# Patient Record
Sex: Male | Born: 1985 | Race: Black or African American | Hispanic: No | Marital: Single | Smoking: Current every day smoker
Health system: Southern US, Community
[De-identification: ages and names within clinical notes are randomized; demographics above are authoritative.]

---
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
## 2008-06-14 ENCOUNTER — Emergency Department: Payer: Self-pay | Admitting: Emergency Medicine

## 2011-08-02 ENCOUNTER — Emergency Department: Payer: Self-pay | Admitting: Emergency Medicine

## 2011-08-03 ENCOUNTER — Emergency Department: Payer: Self-pay | Admitting: Emergency Medicine

## 2011-08-03 LAB — DRUG SCREEN, URINE
Amphetamines, Ur Screen: NEGATIVE (ref ?–1000)
Barbiturates, Ur Screen: NEGATIVE (ref ?–200)
MDMA (Ecstasy)Ur Screen: NEGATIVE (ref ?–500)
Methadone, Ur Screen: NEGATIVE (ref ?–300)
Phencyclidine (PCP) Ur S: NEGATIVE (ref ?–25)

## 2011-08-03 LAB — CBC
HCT: 43.1 % (ref 40.0–52.0)
HGB: 14.3 g/dL (ref 13.0–18.0)
MCHC: 33.3 g/dL (ref 32.0–36.0)
RBC: 4.94 10*6/uL (ref 4.40–5.90)
WBC: 10.9 10*3/uL — ABNORMAL HIGH (ref 3.8–10.6)

## 2011-08-03 LAB — BASIC METABOLIC PANEL
Anion Gap: 8 (ref 7–16)
Calcium, Total: 9.1 mg/dL (ref 8.5–10.1)
Chloride: 104 mmol/L (ref 98–107)
Creatinine: 1.2 mg/dL (ref 0.60–1.30)
EGFR (Non-African Amer.): >60
Glucose: 118 mg/dL — ABNORMAL HIGH (ref 65–99)
Osmolality: 279 (ref 275–301)
Potassium: 3.6 mmol/L (ref 3.5–5.1)
Sodium: 139 mmol/L (ref 136–145)

## 2011-09-20 ENCOUNTER — Emergency Department: Payer: Self-pay | Admitting: Emergency Medicine

## 2014-06-07 ENCOUNTER — Emergency Department
Admission: EM | Admit: 2014-06-07 | Discharge: 2014-06-08 | Disposition: A | Discharge status: Home / Self Care | Payer: Self-pay | Attending: Emergency Medicine | Admitting: Emergency Medicine

## 2014-06-07 ENCOUNTER — Encounter: Payer: Self-pay | Admitting: Emergency Medicine

## 2014-06-07 DIAGNOSIS — F1210 Cannabis abuse, uncomplicated: Secondary | ICD-10-CM

## 2014-06-07 DIAGNOSIS — F32A Depression, unspecified: Secondary | ICD-10-CM

## 2014-06-07 DIAGNOSIS — F4310 Post-traumatic stress disorder, unspecified: Secondary | ICD-10-CM

## 2014-06-07 DIAGNOSIS — Z720 Tobacco use: Secondary | ICD-10-CM | POA: Insufficient documentation

## 2014-06-07 DIAGNOSIS — F329 Major depressive disorder, single episode, unspecified: Secondary | ICD-10-CM | POA: Insufficient documentation

## 2014-06-07 DIAGNOSIS — F320 Major depressive disorder, single episode, mild: Secondary | ICD-10-CM

## 2014-06-07 LAB — URINALYSIS COMPLETE WITH MICROSCOPIC (ARMC ONLY)
Bilirubin Urine: NEGATIVE
Glucose, UA: NEGATIVE mg/dL
Hgb urine dipstick: NEGATIVE
KETONES UR: NEGATIVE mg/dL
LEUKOCYTES UA: NEGATIVE
NITRITE: NEGATIVE
Protein, ur: 30 mg/dL — AB
Specific Gravity, Urine: 1.03 (ref 1.005–1.030)
pH: 6 (ref 5.0–8.0)

## 2014-06-07 LAB — CBC
HCT: 41.2 % (ref 40.0–52.0)
Hemoglobin: 12.9 g/dL — ABNORMAL LOW (ref 13.0–18.0)
MCH: 27.1 pg (ref 26.0–34.0)
MCHC: 31.4 g/dL — ABNORMAL LOW (ref 32.0–36.0)
MCV: 86.3 fL (ref 80.0–100.0)
Platelets: 344 10*3/uL (ref 150–440)
RBC: 4.78 MIL/uL (ref 4.40–5.90)
RDW: 14 % (ref 11.5–14.5)
WBC: 11.3 10*3/uL — ABNORMAL HIGH (ref 3.8–10.6)

## 2014-06-07 LAB — URINE DRUG SCREEN, QUALITATIVE (ARMC ONLY)
Amphetamines, Ur Screen: NOT DETECTED
BARBITURATES, UR SCREEN: NOT DETECTED
Benzodiazepine, Ur Scrn: NOT DETECTED
CANNABINOID 50 NG, UR ~~LOC~~: POSITIVE — AB
Cocaine Metabolite,Ur ~~LOC~~: NOT DETECTED
MDMA (Ecstasy)Ur Screen: NOT DETECTED
Methadone Scn, Ur: NOT DETECTED
OPIATE, UR SCREEN: NOT DETECTED
PHENCYCLIDINE (PCP) UR S: NOT DETECTED
Tricyclic, Ur Screen: NOT DETECTED

## 2014-06-07 LAB — COMPREHENSIVE METABOLIC PANEL
ALT: 13 U/L — AB (ref 17–63)
AST: 14 U/L — ABNORMAL LOW (ref 15–41)
Albumin: 3.7 g/dL (ref 3.5–5.0)
Alkaline Phosphatase: 74 U/L (ref 38–126)
Anion gap: 8 (ref 5–15)
BUN: 12 mg/dL (ref 6–20)
CO2: 26 mmol/L (ref 22–32)
Calcium: 9 mg/dL (ref 8.9–10.3)
Chloride: 105 mmol/L (ref 101–111)
Creatinine, Ser: 1.06 mg/dL (ref 0.61–1.24)
GFR calc Af Amer: >60 mL/min (ref >60)
Glucose, Bld: 90 mg/dL (ref 65–99)
Potassium: 3.7 mmol/L (ref 3.5–5.1)
Sodium: 139 mmol/L (ref 135–145)
TOTAL PROTEIN: 8.1 g/dL (ref 6.5–8.1)
Total Bilirubin: 0.5 mg/dL (ref 0.3–1.2)

## 2014-06-07 LAB — ETHANOL

## 2014-06-07 LAB — ACETAMINOPHEN LEVEL: Acetaminophen (Tylenol), Serum: <10 ug/mL — ABNORMAL LOW (ref 10–30)

## 2014-06-07 LAB — SALICYLATE LEVEL: Salicylate Lvl: <4 mg/dL (ref 2.8–30.0)

## 2014-06-07 NOTE — ED Notes (Signed)
BEHAVIORAL HEALTH ROUNDING Patient sleeping: No. Patient alert and oriented: yes Behavior appropriate: Yes.  ; If no, describe:  Nutrition and fluids offered: Yes  Toileting and hygiene offered: Yes  Sitter present: no Law enforcement present: Yes  

## 2014-06-07 NOTE — ED Notes (Signed)
Pt given sandwich tray and water. Pt resting in bed watching TV.

## 2014-06-07 NOTE — ED Notes (Signed)
Presents feeling depressed ..denies any s/i or h/i

## 2014-06-07 NOTE — ED Notes (Signed)
BEHAVIORAL HEALTH ROUNDING Patient sleeping: Yes.   Patient alert and oriented: yes Behavior appropriate: Yes.  ; If no, describe:  Nutrition and fluids offered: Yes  Toileting and hygiene offered: Yes  Sitter present: no Law enforcement present: Yes  

## 2014-06-07 NOTE — ED Notes (Signed)
Pt reports for the past week, not feeling right. Pt reports calling probation officer today and stating "It would be better if I died." Pt verbalized still thinking these thoughts but deneis SI or having a plan to hurt self. Pt denies HI but reports the area pt lives in is not the safest place and stated "you don't know from day to day, Innocent people die all the time." pt reports bouncing from home to home since leaving prison in 2010 where pt was because of drug dealing. Pt reports only taking pills and doing marijuana recently. When asked about blood products during assessment, pt stated "If it ever came to me dying, don't save me just let me go." Pt denies auditory or visual hallucinations.
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
## 2014-06-07 NOTE — ED Notes (Signed)

## 2014-06-07 NOTE — ED Provider Notes (Signed)
[facility] Emergency Department Provider Note  ____________________________________________  Time seen: Approximately 6:10 PM  I have reviewed the triage vital signs and the nursing notes.   HISTORY  Chief Complaint Depression    HPI Peter Sparks is a 29 y.o. male with a history of depression but which as been untreated who called his probation officer today because he thought that it would be better off if he is dead.  He indicates having worsening depression for several weeks.  He has not had a specific plan for how to kill himself but he does indicate during my evaluation that he thinks about being dead a lot and thinks that it would be better for him.  He denies having any homicidal ideation.  He has no medical complaints or concerns at this time.  He describes his symptoms as severe.   History reviewed. No pertinent past medical history.  There are no active problems to display for this patient.   History reviewed. No pertinent past surgical history.  No current outpatient prescriptions on file.  Allergies Review of patient's allergies indicates no known allergies.  History reviewed. No pertinent family history.  Social History History  Substance Use Topics  . Smoking status: Current Every Day Smoker  . Smokeless tobacco: Not on file  . Alcohol Use: Yes    Review of Systems Constitutional: No fever/chills Eyes: No visual changes. ENT: No sore throat. Cardiovascular: Denies chest pain. Respiratory: Denies shortness of breath. Gastrointestinal: No abdominal pain.  No nausea, no vomiting.  No diarrhea.  No constipation. Genitourinary: Negative for dysuria. Musculoskeletal: Negative for back pain. Skin: Negative for rash. Neurological: Negative for headaches, focal weakness or numbness. Psychiatric:Increasing depression with suicidal ideation without plan 10-point ROS otherwise  negative.  ____________________________________________   PHYSICAL EXAM:  VITAL SIGNS: ED Triage Vitals  Enc Vitals Group     BP 06/07/14 1633 146/77 mmHg     Pulse Rate 06/07/14 1633 56     Resp 06/07/14 1633 18     Temp 06/07/14 1633 98.6 F (37 C)     Temp Source 06/07/14 1633 Oral     SpO2 06/07/14 1633 100 %     Weight 06/07/14 1633 365 lb (165.563 kg)     Height 06/07/14 1633 6' (1.829 m)     Head Cir --      Peak Flow --      Pain Score --      Pain Loc --      Pain Edu? --      Excl. in GC? --     Constitutional: Alert and oriented. Well appearing and in no acute distress. Eyes: Conjunctivae are normal. PERRL. EOMI. Head: Atraumatic. Nose: No congestion/rhinnorhea. Mouth/Throat: Mucous membranes are moist.  Oropharynx non-erythematous. Neck: No stridor.   Cardiovascular: Normal rate, regular rhythm. Grossly normal heart sounds.  Good peripheral circulation. Respiratory: Normal respiratory effort.  No retractions. Lungs CTAB. Gastrointestinal: Soft and nontender. No distention. No abdominal bruits. No CVA tenderness. Musculoskeletal: No lower extremity tenderness nor edema.  No joint effusions. Neurologic:  Normal speech and language. No gross focal neurologic deficits are appreciated. Speech is normal. No gait instability. Skin:  Skin is warm, dry and intact. No rash noted. Psychiatric: The patient has a very flat affect and a depressed mood.  He endorses SI and denies HI.  ____________________________________________   LABS (all labs ordered are listed, but only abnormal results are displayed)  Labs Reviewed  CBC - Abnormal; Notable for  the following:    WBC 11.3 (*)    Hemoglobin 12.9 (*)    MCHC 31.4 (*)    All other components within normal limits  COMPREHENSIVE METABOLIC PANEL - Abnormal; Notable for the following:    AST 14 (*)    ALT 13 (*)    All other components within normal limits  ACETAMINOPHEN LEVEL - Abnormal; Notable for the following:     Acetaminophen (Tylenol), Serum <10 (*)    All other components within normal limits  URINE DRUG SCREEN, QUALITATIVE ([facility] ONLY) - Abnormal; Notable for the following:    Cannabinoid 50 Ng, Ur [place] POSITIVE (*)    All other components within normal limits  URINALYSIS COMPLETEWITH MICROSCOPIC ([facility] ONLY) - Abnormal; Notable for the following:    Color, Urine YELLOW (*)    APPearance HAZY (*)    Protein, ur 30 (*)    Bacteria, UA RARE (*)    Squamous Epithelial / LPF 0-5 (*)    All other components within normal limits  ETHANOL  SALICYLATE LEVEL   ____________________________________________  EKG  Not indicated ____________________________________________  RADIOLOGY  Not indicated  ____________________________________________  INITIAL IMPRESSION / ASSESSMENT AND PLAN / ED COURSE  Pertinent labs & imaging results that were available during my care of the patient were reviewed by me and considered in my medical decision making (see chart for details).  The patient has a very flat affect and depressed mood and he is endorsing SI.  He is voluntary at this time but I am going to IVC and given that I believe he is at high risk given his drug abuse history (marijuana and Molly) and history of prior incarceration.  He has no acute medical issues at this time.  ____________________________________________  FINAL CLINICAL IMPRESSION(S) / ED DIAGNOSES  Final diagnoses:  Depression      NEW MEDICATIONS STARTED DURING THIS VISIT:  New Prescriptions   No medications on file     Loleta Rose, MD 06/07/14 [phone]

## 2014-06-08 DIAGNOSIS — F1210 Cannabis abuse, uncomplicated: Secondary | ICD-10-CM

## 2014-06-08 DIAGNOSIS — F4310 Post-traumatic stress disorder, unspecified: Secondary | ICD-10-CM

## 2014-06-08 DIAGNOSIS — F320 Major depressive disorder, single episode, mild: Secondary | ICD-10-CM

## 2014-06-08 NOTE — ED Notes (Signed)
[facility]. Patient care assumed. Patient/RN introduction complete. Will continue to monitor.

## 2014-06-08 NOTE — ED Notes (Signed)

## 2014-06-08 NOTE — ED Notes (Signed)
BEHAVIORAL HEALTH ROUNDING Patient sleeping: Yes.   Patient alert and oriented: not applicable Behavior appropriate: Yes.  ; If no, describe:  Nutrition and fluids offered: No Toileting and hygiene offered: No Sitter present: yes Law enforcement present: Yes   

## 2014-06-08 NOTE — ED Notes (Signed)
BEHAVIORAL HEALTH ROUNDING Patient sleeping: No. Patient alert and oriented: yes Behavior appropriate: Yes.  ; If no, describe:  Nutrition and fluids offered: Yes  Toileting and hygiene offered: Yes  Sitter present: yes Law enforcement present: Yes  

## 2014-06-08 NOTE — ED Notes (Signed)
Pt has friend visiting.

## 2014-06-08 NOTE — ED Notes (Signed)

## 2014-06-08 NOTE — Discharge Instructions (Signed)

## 2014-06-08 NOTE — ED Notes (Signed)
No change in condition will continue to monitor  

## 2014-06-08 NOTE — ED Notes (Signed)
ENVIRONMENTAL ASSESSMENT Potentially harmful objects out of patient reach: Yes.   Personal belongings secured: Yes.   Patient dressed in hospital provided attire only: Yes.   Plastic bags out of patient reach: Yes.   Patient care equipment (cords, cables, call bells, lines, and drains) shortened, removed, or accounted for: Yes.   Equipment and supplies removed from bottom of stretcher: Yes.   Potentially toxic materials out of patient reach: Yes.   Sharps container removed or out of patient reach: Yes.   BEHAVIORAL HEALTH ROUNDING Patient sleeping: No. Patient alert and oriented: yes Behavior appropriate: Yes.   Nutrition and fluids offered: Yes  Toileting and hygiene offered: Yes  Sitter present: q15 min observations and security camera monitoring Law enforcement present: Yes [employer]  ED BHU PLACEMENT JUSTIFICATION Is the patient under IVC or is there intent for IVC: No. Is the patient medically cleared: Yes.   Is there vacancy in the ED BHU: Yes.   Is the population mix appropriate for patient: No. Is the patient awaiting placement in inpatient or outpatient setting: No. Has the patient had a psychiatric consult: Yes.   Survey of unit performed for contraband, proper placement and condition of furniture, tampering with fixtures in bathroom, shower, and each patient room: Yes.  ; Findings: none APPEARANCE/BEHAVIOR calm NEURO ASSESSMENT Orientation: person Hallucinations: No.None noted at this time Speech: Normal Gait: normal RESPIRATORY ASSESSMENT No respiratory distress noted CARDIOVASCULAR ASSESSMENT Skin color appropriate for age and race GASTROINTESTINAL ASSESSMENT no GI distress noted EXTREMITIES Moves all extremities PLAN OF CARE Provide calm/safe environment. Vital signs assessed twice daily. ED BHU Assessment once each 12-hour shift. Collaborate with intake RN daily or as condition indicates. Assure the ED provider has rounded once each shift. Provide and  encourage hygiene. Provide redirection as needed. Assess for escalating behavior; address immediately and inform ED provider.  Assess family dynamic and appropriateness for visitation as needed: Yes.   Educate the patient/family about BHU procedures/visitation: Yes.

## 2014-06-08 NOTE — ED Notes (Signed)
Pt laying in bed.  

## 2014-06-08 NOTE — ED Provider Notes (Signed)
Patient seen and cleared for discharge by Dr. Toni Amendlapacs.  Sharyn CreamerMark Paulino Cork, MD 06/08/14 [phone]

## 2014-06-08 NOTE — ED Notes (Signed)
BEHAVIORAL HEALTH ROUNDING Patient sleeping: Yes.   Patient alert and oriented: yes Behavior appropriate: Yes.  ; If no, describe:  Nutrition and fluids offered: No Toileting and hygiene offered: No Sitter present: yes Law enforcement present: Yes  

## 2014-06-08 NOTE — Consult Note (Signed)
[facility] Face-to-Face Psychiatry Consult   Reason for Consult:  Consult for this 29 year old man came to the emergency room voluntarily in the company of his probation officer Referring Physician:  quale Patient Identification: Peter Sparks MRN:  [id number] Principal Diagnosis: Depression, major, single episode, mild Diagnosis:   Patient Active Problem List   Diagnosis Date Noted  . Depression, major, single episode, mild [F32.0] 06/08/2014  . Cannabis abuse [F12.10] 06/08/2014  . PTSD (post-traumatic stress disorder) [F43.10] 06/08/2014    Total Time spent with patient: 1 hour  Subjective:   Peter Sparks is a 29 y.o. 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 Complaints are relatively vague. Patient does complain of being "stressed out".  HPI:  Information from the patient primarily some from the chart. This is a 29 year old man who evidently has had no previous psychiatric treatment who is probation officer told him to come to the emergency room today because he was concerned about him. Exactly in what way he was concerned is unclear. The patient himself admits that he's been stressed out and says that people in his family told him that he needs to talk to someone because he is acting "bipolar". He is at something of a loss to describe this but when I ask him if he meant that his mood had been very up and down and extreme he said that he thought that might be it. He himself said that his mood is not feeling so good and he described himself as being depressed. Probably been going on for a few weeks. Sleep is poor and frequently wakes at night. Appetite normal. Denies any acute suicidal thoughts. Denies feeling hopeless. Patient indicates that there are a lot of things in his life that are  bothering him but that he is unwilling to discuss them. He gives some hints that he is still involved possibly in the illegal activities which may be one reason why he doesn't want to discuss them. Also it sounds like he's had a lot of experiences in prison. He denies having any hallucinations or psychotic symptoms. He denies that he is abusing any drugs except for marijuana regularly.  Past psychiatric history: Says that he was involved in counseling groups while he was in prison but that he thought that he never had anything in common with people who were severely ill there. He denies any past psychiatric hospitalization and denies any past psychiatric medicine being prescribed no history of suicide attempts.  Social history: Patient got out of prison last summer and is on probation. He is living with a couple of his siblings. He says that his siblings of the people he is closest to. He is not working regularly but he says that he "gets by" he does not have any children that he claims.  Family history: He thinks there is family history of substance abuse and also of some depression. Also thinks that there is someone who has "bipolar".  Medical history: No known ongoing significant medical problems  Medications none HPI Elements:   Quality:  Dysphoric mood and anxiety. Severity:  Mild to moderate. Timing:  Probably at least a month may be more. Duration:  Symptoms are fairly consistent at this point. Context:  Still not working regularly and he says he is having a hard  time adjusting to life outside of prison..  Past Medical History: History reviewed. No pertinent past medical history. History reviewed. No pertinent past surgical history. Family History: History reviewed. No pertinent family history. Social History:  History  Alcohol Use  . Yes     History  Drug Use  . Yes  . Special: Marijuana    Comment: Molly    History   Social History  . Marital Status: Single    Spouse Name:  N/A  . Number of Children: N/A  . Years of Education: N/A   Social History Main Topics  . Smoking status: Current Every Day Smoker  . Smokeless tobacco: Not on file  . Alcohol Use: Yes  . Drug Use: Yes    Special: Marijuana     Comment: Molly  . Sexual Activity: Not Currently   Other Topics Concern  . None   Social History Narrative  . None   Additional Social History:    History of alcohol / drug use?: Yes Longest period of sobriety (when/how long): 2 weeks Negative Consequences of Use: Financial, Personal relationships, Legal Withdrawal Symptoms: Agitation, Irritability Name of Substance 1: MDMA 1 - Age of First Use: 24 1 - Amount (size/oz): Unsure 1 - Frequency: 3-4 times week 1 - Last Use / Amount: 06/01/2014 Name of Substance 2: Marijuana 2 - Age of First Use: 10 2 - Amount (size/oz): Unsure 2 - Frequency: Random - due to probation, prior use daily 2 - Last Use / Amount: 06/06/2014 Name of Substance 3: Percocet 3 - Age of First Use: 20 3 - Amount (size/oz): 10 mg 3 - Frequency: Daily 3 - Last Use / Amount: about a month ago               Allergies:  No Known Allergies  Labs:  Results for orders placed or performed during the hospital encounter of 06/07/14 (from the past 48 hour(s))  Urine Drug Screen, Qualitative ([place] only)     Status: Abnormal   Collection Time: 06/07/14  5:00 PM  Result Value Ref Range   Tricyclic, Ur Screen NONE DETECTED NONE DETECTED   Amphetamines, Ur Screen NONE DETECTED NONE DETECTED   MDMA (Ecstasy)Ur Screen NONE DETECTED NONE DETECTED   Cocaine Metabolite,Ur [place] NONE DETECTED NONE DETECTED   Opiate, Ur Screen NONE DETECTED NONE DETECTED   Phencyclidine (PCP) Ur S NONE DETECTED NONE DETECTED   Cannabinoid 50 Ng, Ur [place] POSITIVE (A) NONE DETECTED   Barbiturates, Ur Screen NONE DETECTED NONE DETECTED   Benzodiazepine, Ur Scrn NONE DETECTED NONE DETECTED   Methadone Scn, Ur NONE DETECTED NONE DETECTED    Comment: (NOTE) 641   Tricyclics, urine               Cutoff 1000 ng/mL 200  Amphetamines, urine             Cutoff 1000 ng/mL 300  MDMA (Ecstasy), urine           Cutoff 500 ng/mL 400  Cocaine Metabolite, urine       Cutoff 300 ng/mL 500  Opiate, urine                   Cutoff 300 ng/mL 600  Phencyclidine (PCP), urine      Cutoff 25 ng/mL 700  Cannabinoid, urine              Cutoff 50 ng/mL 800  Barbiturates, urine  Cutoff 200 ng/mL 900  Benzodiazepine, urine           Cutoff 200 ng/mL 1000 Methadone, urine                Cutoff 300 ng/mL 1100 1200 The urine drug screen provides only a preliminary, unconfirmed 1300 analytical test result and should not be used for non-medical 1400 purposes. Clinical consideration and professional judgment should 1500 be applied to any positive drug screen result due to possible 1600 interfering substances. A more specific alternate chemical method 1700 must be used in order to obtain a confirmed analytical result.  1800 Gas chromato graphy / mass spectrometry (GC/MS) is the preferred 1900 confirmatory method.   Urinalysis complete, with microscopic ([facility] only)     Status: Abnormal   Collection Time: 06/07/14  5:00 PM  Result Value Ref Range   Color, Urine YELLOW (A) YELLOW   APPearance HAZY (A) CLEAR   Glucose, UA NEGATIVE NEGATIVE mg/dL   Bilirubin Urine NEGATIVE NEGATIVE   Ketones, ur NEGATIVE NEGATIVE mg/dL   Specific Gravity, Urine 1.030 1.005 - 1.030   Hgb urine dipstick NEGATIVE NEGATIVE   pH 6.0 5.0 - 8.0   Protein, ur 30 (A) NEGATIVE mg/dL   Nitrite NEGATIVE NEGATIVE   Leukocytes, UA NEGATIVE NEGATIVE   RBC / HPF 0-5 0 - 5 RBC/hpf   WBC, UA 0-5 0 - 5 WBC/hpf   Bacteria, UA RARE (A) NONE SEEN   Squamous Epithelial / LPF 0-5 (A) NONE SEEN   Mucous PRESENT   CBC     Status: Abnormal   Collection Time: 06/07/14  5:15 PM  Result Value Ref Range   WBC 11.3 (H) 3.8 - 10.6 K/uL   RBC 4.78 4.40 - 5.90 MIL/uL   Hemoglobin 12.9 (L) 13.0 - 18.0 g/dL    HCT 41.2 40.0 - 52.0 %   MCV 86.3 80.0 - 100.0 fL   MCH 27.1 26.0 - 34.0 pg   MCHC 31.4 (L) 32.0 - 36.0 g/dL   RDW 14.0 11.5 - 14.5 %   Platelets 344 150 - 440 K/uL  Comprehensive metabolic panel     Status: Abnormal   Collection Time: 06/07/14  5:15 PM  Result Value Ref Range   Sodium 139 135 - 145 mmol/L   Potassium 3.7 3.5 - 5.1 mmol/L   Chloride 105 101 - 111 mmol/L   CO2 26 22 - 32 mmol/L   Glucose, Bld 90 65 - 99 mg/dL   BUN 12 6 - 20 mg/dL   Creatinine, Ser 1.06 0.61 - 1.24 mg/dL   Calcium 9.0 8.9 - 10.3 mg/dL   Total Protein 8.1 6.5 - 8.1 g/dL   Albumin 3.7 3.5 - 5.0 g/dL   AST 14 (L) 15 - 41 U/L   ALT 13 (L) 17 - 63 U/L   Alkaline Phosphatase 74 38 - 126 U/L   Total Bilirubin 0.5 0.3 - 1.2 mg/dL   GFR calc non Af Amer >60 >60 mL/min   GFR calc Af Amer >60 >60 mL/min    Comment: (NOTE) The eGFR has been calculated using the CKD EPI equation. This calculation has not been validated in all clinical situations. eGFR's persistently <60 mL/min signify possible Chronic Kidney Disease.    Anion gap 8 5 - 15  Ethanol (ETOH)     Status: None   Collection Time: 06/07/14  5:15 PM  Result Value Ref Range   Alcohol, Ethyl (B) <5 <5 mg/dL    Comment:  LOWEST DETECTABLE LIMIT FOR SERUM ALCOHOL IS 11 mg/dL FOR MEDICAL PURPOSES ONLY   Acetaminophen level     Status: Abnormal   Collection Time: 06/07/14  5:15 PM  Result Value Ref Range   Acetaminophen (Tylenol), Serum <10 (L) 10 - 30 ug/mL    Comment:        THERAPEUTIC CONCENTRATIONS VARY SIGNIFICANTLY. A RANGE OF 10-30 ug/mL MAY BE AN EFFECTIVE CONCENTRATION FOR MANY PATIENTS. HOWEVER, SOME ARE BEST TREATED AT CONCENTRATIONS OUTSIDE THIS RANGE. ACETAMINOPHEN CONCENTRATIONS >150 ug/mL AT 4 HOURS AFTER INGESTION AND >50 ug/mL AT 12 HOURS AFTER INGESTION ARE OFTEN ASSOCIATED WITH TOXIC REACTIONS.   Salicylate level     Status: None   Collection Time: 06/07/14  5:15 PM  Result Value Ref Range   Salicylate Lvl  <8.9 2.8 - 30.0 mg/dL    Vitals: Blood pressure 132/84, pulse 87, temperature 98.1 F (36.7 C), temperature source Oral, resp. rate 18, height 6' (1.829 m), weight 165.563 kg (365 lb), SpO2 100 %.  Risk to Self: Suicidal Ideation: No (States no, but says "I feel like I'd be better off dead") Suicidal Intent: No Is patient at risk for suicide?: No Suicidal Plan?: No Access to Means: No What has been your use of drugs/alcohol within the last 12 months?: Use of MDMA, Marijuana, and Percocets How many times?: 0 Other Self Harm Risks: None reported Triggers for Past Attempts: Other (Comment) (None reported) Intentional Self Injurious Behavior: None Risk to Others: Homicidal Ideation: No Thoughts of Harm to Others: No Current Homicidal Intent: No Current Homicidal Plan: No Access to Homicidal Means: No Identified Victim: None reported History of harm to others?: No Assessment of Violence: In distant past Does patient have access to weapons?: No Criminal Charges Pending?: No Does patient have a court date: No Prior Inpatient Therapy: Prior Inpatient Therapy: No Prior Outpatient Therapy: Does patient have an ACCT team?: No Does patient have Intensive In-House Services?  : No Does patient have [facility] services? : No Does patient have [facility] services?: No  No current facility-administered medications for this encounter.   No current outpatient prescriptions on file.    Musculoskeletal: Strength & Muscle Tone: within normal limits Gait & Station: normal Patient leans: N/A  Psychiatric Specialty Exam: Physical Exam  Constitutional: He appears well-developed and well-nourished.  HENT:  Head: Normocephalic and atraumatic.  Eyes: Conjunctivae are normal. Pupils are equal, round, and reactive to light.  Neck: Normal range of motion.  Cardiovascular: Normal heart sounds.   Respiratory: Effort normal.  GI: Soft.  Musculoskeletal: Normal range of motion.  Neurological: He is alert.   Skin: Skin is warm and dry.  Psychiatric: Judgment and thought content normal. His affect is blunt. His speech is delayed. He is withdrawn. Cognition and memory are normal.  Patient is very guarded and hesitant to discuss anything about his personal problems but does not appear to be hostile and does appear to be lucid with no sign of psychosis.    Review of Systems  Constitutional: Negative.   HENT: Negative.   Eyes: Negative.   Respiratory: Negative.   Cardiovascular: Negative.   Gastrointestinal: Negative.   Musculoskeletal: Negative.   Skin: Negative.   Neurological: Negative.   Psychiatric/Behavioral: Positive for depression and substance abuse. Negative for suicidal ideas, hallucinations and memory loss. The patient is nervous/anxious and has insomnia.     Blood pressure 132/84, pulse 87, temperature 98.1 F (36.7 C), temperature source Oral, resp. rate 18, height 6' (1.829 m), weight 165.563 kg (  365 lb), SpO2 100 %.Body mass index is 49.49 kg/(m^2).  General Appearance: Negative  Eye Contact::  Good  Speech:  Clear and Coherent  Volume:  Decreased  Mood:  Depressed  Affect:  Blunt  Thought Process:  Linear and Logical  Orientation:  Full (Time, Place, and Person)  Thought Content:  Negative  Suicidal Thoughts:  No  Homicidal Thoughts:  No  Memory:  Immediate;   Good Recent;   Good Remote;   Good  Judgement:  Intact  Insight:  Fair  Psychomotor Activity:  Normal  Concentration:  Good  Recall:  Good  Fund of Knowledge:Good  Language: Good  Akathisia:  No  Handed:  Right  AIMS (if indicated):     Assets:  Desire for Improvement Housing Physical Health Social Support  ADL's:  Intact  Cognition: WNL  Sleep:      Medical Decision Making: New problem, with additional work up planned, Review of Psycho-Social Stressors (1), Review or order clinical lab tests (1) and Review or order medicine tests (1)  Treatment Plan Summary: Plan Patient does not meet commitment  criteria and really does not appear to require inpatient hospitalization. There is no indication of suicidal behavior or homicidal behavior and he is denying suicidal or homicidal ideation. He is lucid and calm. He is not very forthcoming with information and seems to probably have little experience with trusting people. Hard to say exactly whether his diagnosis might be posttraumatic stress disorder as I suspect or mild to moderate depression or an adjustment disorder. For now I will give him a major depression mild along with rule out PTSD and cannabis abuse. Patient is strongly encouraged to go to [facility] for outpatient treatment. Psychoeducation completed. Case will be discussed with emergency room physician and he may be discharged at this time. No prescriptions written I'm not sure what the right thing would be and I don't want to add any extra side effects or problems.  Plan:  No evidence of imminent risk to self or others at present.   Patient does not meet criteria for psychiatric inpatient admission. Supportive therapy provided about ongoing stressors. Discussed crisis plan, support from social network, calling 911, coming to the Emergency Department, and calling Suicide Hotline. Disposition: Patient may be discharged at the discretion of the emergency room physician. He is to follow-up with community mental health care  Alethia Berthold 06/08/2014 7:35 PM

## 2014-06-08 NOTE — ED Notes (Signed)

## 2014-06-08 NOTE — ED Notes (Signed)

## 2014-06-08 NOTE — ED Notes (Signed)
BEHAVIORAL HEALTH ROUNDING Patient sleeping: Yes.   Patient alert and oriented: not applicable Behavior appropriate: Yes.  ; If no, describe:  Nutrition and fluids offered: No Toileting and hygiene offered: No Sitter present: yes Law enforcement present: Yes   ENVIRONMENTAL ASSESSMENT Potentially harmful objects out of patient reach: Yes.   Personal belongings secured: Yes.   Patient dressed in hospital provided attire only: Yes.   Plastic bags out of patient reach: Yes.   Patient care equipment (cords, cables, call bells, lines, and drains) shortened, removed, or accounted for: Yes.   Equipment and supplies removed from bottom of stretcher: Yes.   Potentially toxic materials out of patient reach: Yes.   Sharps container removed or out of patient reach: Yes.    

## 2014-06-08 NOTE — ED Notes (Signed)
Pt calm and cooperative at this time with no complaints of pain or discomfort will continue to monitor.   

## 2014-06-08 NOTE — Progress Notes (Signed)
LCSW met with patient and he reported he was not suicidal or homicidal or hearing or seeing things. Patient will follow up with [facility]. Reviewed suicide prevention and intervention with patient and handout provided. Consulted with Dr Clapacs/Dr Quale and patient discharge documentation will be completed. [facility] handouts will be given to patients by ED nurses. 

## 2014-06-08 NOTE — ED Notes (Signed)
Pt. transfered to BHU without incident after report from. Placed in room and oriented to unit. Pt. informed that for their safety all care areas are designed for safety and monitored by security cameras at all times; and visiting hours explained to patient. Patient verbalizes understanding, and verbal contract for safety obtained.   

## 2014-06-08 NOTE — ED Provider Notes (Signed)
-----------------------------------------   1:44 PM on 06/08/2014 -----------------------------------------  No acute events overnight. Patient calm, cooperative per nursing. Patient here with vague SI-like statements, and admitted increased depression. Patient has been seen by the behavioral health nurse, currently awaiting psychiatrist evaluation for proper disposition. Labs are largely within normal limits, urine toxicology positive for cannabinoids.  Minna AntisKevin Wiley Magan, MD 06/08/14 1345

## 2014-06-08 NOTE — ED Notes (Signed)
BEHAVIORAL HEALTH ROUNDING Patient sleeping: No. Patient alert and oriented: Yes Behavior appropriate: Yes.  ; If no, describe:  Nutrition and fluids offered: Yes  Toileting and hygiene offered: Yes  Sitter present: No Law enforcement present: Yes  

## 2014-07-10 ENCOUNTER — Emergency Department: Payer: Self-pay

## 2014-07-10 ENCOUNTER — Other Ambulatory Visit: Payer: Self-pay

## 2014-07-10 ENCOUNTER — Encounter: Payer: Self-pay | Admitting: Emergency Medicine

## 2014-07-10 ENCOUNTER — Emergency Department
Admission: EM | Admit: 2014-07-10 | Discharge: 2014-07-11 | Disposition: A | Discharge status: Home / Self Care | Payer: Self-pay | Attending: Emergency Medicine | Admitting: Emergency Medicine

## 2014-07-10 DIAGNOSIS — R109 Unspecified abdominal pain: Secondary | ICD-10-CM | POA: Insufficient documentation

## 2014-07-10 DIAGNOSIS — Z720 Tobacco use: Secondary | ICD-10-CM | POA: Insufficient documentation

## 2014-07-10 DIAGNOSIS — R1110 Vomiting, unspecified: Secondary | ICD-10-CM

## 2014-07-10 DIAGNOSIS — E860 Dehydration: Secondary | ICD-10-CM | POA: Insufficient documentation

## 2014-07-10 DIAGNOSIS — R197 Diarrhea, unspecified: Secondary | ICD-10-CM | POA: Insufficient documentation

## 2014-07-10 DIAGNOSIS — B349 Viral infection, unspecified: Secondary | ICD-10-CM | POA: Insufficient documentation

## 2014-07-10 DIAGNOSIS — R509 Fever, unspecified: Secondary | ICD-10-CM

## 2014-07-10 LAB — PROTIME-INR
INR: 1.08
Prothrombin Time: 14.2 seconds (ref 11.4–15.0)

## 2014-07-10 LAB — POCT RAPID STREP A: STREPTOCOCCUS, GROUP A SCREEN (DIRECT): NEGATIVE

## 2014-07-10 LAB — CBC WITH DIFFERENTIAL/PLATELET
BASOS ABS: 0 10*3/uL (ref 0–0.1)
Basophils Relative: 0 %
Eosinophils Absolute: 0 10*3/uL (ref 0–0.7)
Eosinophils Relative: 0 %
HEMATOCRIT: 38 % — AB (ref 40.0–52.0)
Hemoglobin: 12.7 g/dL — ABNORMAL LOW (ref 13.0–18.0)
Lymphocytes Relative: 22 %
Lymphs Abs: 2.9 10*3/uL (ref 1.0–3.6)
MCH: 27.9 pg (ref 26.0–34.0)
MCHC: 33.4 g/dL (ref 32.0–36.0)
MCV: 83.3 fL (ref 80.0–100.0)
Monocytes Absolute: 1.4 10*3/uL — ABNORMAL HIGH (ref 0.2–1.0)
Monocytes Relative: 11 %
Neutro Abs: 8.8 10*3/uL — ABNORMAL HIGH (ref 1.4–6.5)
Neutrophils Relative %: 67 %
Platelets: 280 10*3/uL (ref 150–440)
RBC: 4.56 MIL/uL (ref 4.40–5.90)
RDW: 13.4 % (ref 11.5–14.5)
WBC: 13.2 10*3/uL — ABNORMAL HIGH (ref 3.8–10.6)

## 2014-07-10 LAB — APTT: aPTT: 31 seconds (ref 24–36)

## 2014-07-10 MED ORDER — ACETAMINOPHEN 325 MG PO TABS
ORAL_TABLET | ORAL | Status: AC
  Administered 2014-07-10: 650 mg via ORAL
  Filled 2014-07-10: qty 2

## 2014-07-10 MED ORDER — ACETAMINOPHEN 325 MG PO TABS
650.0000 mg | ORAL_TABLET | Freq: Four times a day (QID) | ORAL | Status: DC | PRN
  Administered 2014-07-10: 650 mg via ORAL

## 2014-07-10 MED ORDER — SODIUM CHLORIDE 0.9 % IV BOLUS (SEPSIS)
1000.0000 mL | INTRAVENOUS | Status: AC
  Administered 2014-07-10 – 2014-07-11 (×3): 1000 mL via INTRAVENOUS

## 2014-07-10 MED ORDER — SODIUM CHLORIDE 0.9 % IV BOLUS (SEPSIS): 1000.0000 mL | INTRAVENOUS | Status: AC

## 2014-07-10 NOTE — ED Notes (Signed)
Pt presents w/ c/o n/v/d x 3-4 days.

## 2014-07-10 NOTE — ED Notes (Addendum)
Pt. States vomiting, diarrhea, chills, fever and sore throat for the past 3 days.  Pt. Denies anyone in household with same symptoms.  Pt. States starting Theraflu Thursday morning with no relief.

## 2014-07-11 ENCOUNTER — Encounter: Payer: Self-pay | Admitting: Emergency Medicine

## 2014-07-11 ENCOUNTER — Emergency Department: Payer: Self-pay

## 2014-07-11 LAB — URINALYSIS COMPLETE WITH MICROSCOPIC (ARMC ONLY)
Bacteria, UA: NONE SEEN
Bilirubin Urine: NEGATIVE
Glucose, UA: NEGATIVE mg/dL
Hgb urine dipstick: NEGATIVE
Ketones, ur: NEGATIVE mg/dL
Leukocytes, UA: NEGATIVE
Nitrite: NEGATIVE
PH: 7 (ref 5.0–8.0)
PROTEIN: NEGATIVE mg/dL
Specific Gravity, Urine: 1.046 — ABNORMAL HIGH (ref 1.005–1.030)

## 2014-07-11 LAB — COMPREHENSIVE METABOLIC PANEL
ALT: 13 U/L — AB (ref 17–63)
AST: 18 U/L (ref 15–41)
Albumin: 3.3 g/dL — ABNORMAL LOW (ref 3.5–5.0)
Alkaline Phosphatase: 60 U/L (ref 38–126)
Anion gap: 10 (ref 5–15)
BUN: 14 mg/dL (ref 6–20)
CALCIUM: 8.2 mg/dL — AB (ref 8.9–10.3)
CO2: 22 mmol/L (ref 22–32)
Chloride: 102 mmol/L (ref 101–111)
Creatinine, Ser: 1.32 mg/dL — ABNORMAL HIGH (ref 0.61–1.24)
GFR calc Af Amer: >60 mL/min (ref >60)
Glucose, Bld: 108 mg/dL — ABNORMAL HIGH (ref 65–99)
Potassium: 3 mmol/L — ABNORMAL LOW (ref 3.5–5.1)
SODIUM: 134 mmol/L — AB (ref 135–145)
Total Bilirubin: 0.9 mg/dL (ref 0.3–1.2)
Total Protein: 7.8 g/dL (ref 6.5–8.1)

## 2014-07-11 LAB — TROPONIN I

## 2014-07-11 LAB — LACTIC ACID, PLASMA: Lactic Acid, Venous: 1 mmol/L (ref 0.5–2.0)

## 2014-07-11 LAB — LIPASE, BLOOD: LIPASE: 26 U/L (ref 22–51)

## 2014-07-11 MED ORDER — IOHEXOL 350 MG/ML SOLN
125.0000 mL | Freq: Once | INTRAVENOUS | Status: AC | PRN
  Administered 2014-07-11: 125 mL via INTRAVENOUS

## 2014-07-11 MED ORDER — IOHEXOL 240 MG/ML SOLN
25.0000 mL | Freq: Once | INTRAMUSCULAR | Status: AC | PRN
  Administered 2014-07-11: 25 mL via ORAL

## 2014-07-11 MED ORDER — ONDANSETRON 4 MG PO TBDP: 4.0000 mg | ORAL_TABLET | Freq: Three times a day (TID) | ORAL | Status: DC | PRN

## 2014-07-11 NOTE — ED Provider Notes (Signed)
[facility] Emergency Department Provider Note  ____________________________________________  Time seen: Approximately 2306 PM  I have reviewed the triage vital signs and the nursing notes.   HISTORY  Chief Complaint Emesis    HPI Peter Sparks is a 29 y.o. male who comes in with sweats diarrhea and vomiting. The patient reports that the symptoms started 3 days ago. He reports that he had been trying to take TheraFlu and NyQuil but he has been unable to keep anything down. The patient reports that he has not eaten much in the last 2 days. He reports some pain in his mid abdomen and his lower back which is a 7 out of 10 in intensity. He reports that his lower back hurts worse than his abdomen. He reports his urine is very orange and he feels hot and cold chills. The patient denies sick contacts and feels as though something is built up in his throat. He has had some shortness of breath and cough headache myalgias as well. The patient was not feeling well so he decided to come in for evaluation.   History reviewed. No pertinent past medical history.  Patient Active Problem List   Diagnosis Date Noted  . Depression, major, single episode, mild 06/08/2014  . Cannabis abuse 06/08/2014  . PTSD (post-traumatic stress disorder) 06/08/2014    History reviewed. No pertinent past surgical history.  No current outpatient prescriptions on file.  Allergies Review of patient's allergies indicates no known allergies.  No family history on file.  Social History History  Substance Use Topics  . Smoking status: Current Every Day Smoker  . Smokeless tobacco: Not on file  . Alcohol Use: Yes    Review of Systems Constitutional: fever/chills Eyes: No visual changes. ENT: sore throat. Cardiovascular: Denies chest pain. Respiratory: shortness of breath and cough Gastrointestinal:  abdominal pain, nausea, vomiting, diarrhea.   Genitourinary: Negative for  dysuria. Musculoskeletal: back pain. Skin: Negative for rash. Neurological: headaches,   10-point ROS otherwise negative.  ____________________________________________   PHYSICAL EXAM:  VITAL SIGNS: ED Triage Vitals  Enc Vitals Group     BP 07/10/14 2237 98/76 mmHg     Pulse Rate 07/10/14 2237 121     Resp 07/10/14 2330 18     Temp 07/10/14 2237 103.1 F (39.5 C)     Temp src --      SpO2 07/10/14 2237 97 %     Weight 07/10/14 2237 345 lb (156.491 kg)     Height 07/10/14 2237  (1.854 m)     Head Cir --      Peak Flow --      Pain Score 07/10/14 2238 7     Pain Loc --      Pain Edu? --      Excl. in GC? --     Constitutional: Alert and oriented. Well appearing and in moderate distress. Eyes: Conjunctivae are normal. PERRL. EOMI. Head: Atraumatic. Nose: No congestion/rhinnorhea. Mouth/Throat: Mucous membranes are moist.  Mildly erythematous oropharynx with some vesicles and exudates on bilateral tonsils Hematological/Lymphatic/Immunilogical:  cervical lymphadenopathy right greater than left Cardiovascular: Tachycardia regular rhythm. Grossly normal heart sounds.  Good peripheral circulation. Respiratory: Normal respiratory effort.  No retractions. Lungs CTAB. Gastrointestinal: Soft and upper abdominal tenderness to palpation positive bowel sounds. Genitourinary: Deferred Musculoskeletal: No lower extremity tenderness nor edema.   Neurologic:  Normal speech and language. No gross focal neurologic deficits are appreciated.  Skin:  Skin is warm, dry and intact. No  rash noted. Psychiatric: Mood and affect are normal.   ____________________________________________   LABS (all labs ordered are listed, but only abnormal results are displayed)  Labs Reviewed  CBC WITH DIFFERENTIAL/PLATELET - Abnormal; Notable for the following:    WBC 13.2 (*)    Hemoglobin 12.7 (*)    HCT 38.0 (*)    Neutro Abs 8.8 (*)    Monocytes Absolute 1.4 (*)    All other components within  normal limits  COMPREHENSIVE METABOLIC PANEL - Abnormal; Notable for the following:    Sodium 134 (*)    Potassium 3.0 (*)    Glucose, Bld 108 (*)    Creatinine, Ser 1.32 (*)    Calcium 8.2 (*)    Albumin 3.3 (*)    ALT 13 (*)    All other components within normal limits  CULTURE, BLOOD (ROUTINE X 2)  CULTURE, BLOOD (ROUTINE X 2)  URINE CULTURE  LACTIC ACID, PLASMA  LIPASE, BLOOD  TROPONIN I  APTT  PROTIME-INR  URINALYSIS COMPLETEWITH MICROSCOPIC ([facility] ONLY)  LACTIC ACID, PLASMA  POCT RAPID STREP A   ____________________________________________  EKG  ED ECG REPORT I, Rebecka ApleyWebster,  Allison P, the attending physician, personally viewed and interpreted this ECG.   Date: 07/11/2014  EKG Time: 2340  Rate: 99  Rhythm: normal sinus rhythm  Axis: normal  Intervals:none  ST&T Change: None  ____________________________________________  RADIOLOGY  Chest x-ray: No definitive pneumonia but limited at the bases  CT abdomen and pelvis: Unremarkable contrast enhanced CT of the abdomen and pelvis ____________________________________________   PROCEDURES  Procedure(s) performed: None  Critical Care performed: No  ____________________________________________   INITIAL IMPRESSION / ASSESSMENT AND PLAN / ED COURSE  Pertinent labs & imaging results that were available during my care of the patient were reviewed by me and considered in my medical decision making (see chart for details).  This is a 29 year old male who comes in with fever nausea vomiting and diarrhea and abdominal pain. The patient does have an elevated white blood cell count fever and tachycardia. I will do some imaging of the patient's abdomen as well as give the patient some Tylenol and 2 L of normal saline for his tachycardia. I will reassess the patient once I received all of his blood work and imaging.  ----------------------------------------- 3:32 AM on  07/11/2014 -----------------------------------------  The patient did receive 3 full liters of normal saline while in the emergency department. His temperature improved after some Tylenol and he did feel mildly improved after some fluids. The patient's CT scan is unremarkable and his blood work is significant for dehydration. The patient's urinalysis does not show any infection. He will be discharged to home to [facility] or return here with worse symptoms ____________________________________________   FINAL CLINICAL IMPRESSION(S) / ED DIAGNOSES  Final diagnoses:  Fever, unspecified fever cause  Viral illness  Vomiting and diarrhea  Dehydration      Rebecka ApleyAllison P Webster, MD 07/11/14 [id number]

## 2014-07-11 NOTE — Discharge Instructions (Signed)
Dehydration, Adult Dehydration is when you lose more fluids from the body than you take in. Vital organs like the kidneys, brain, and heart cannot function without a proper amount of fluids and salt. Any loss of fluids from the body can cause dehydration.  CAUSES   Vomiting.  Diarrhea.  Excessive sweating.  Excessive urine output.  Fever. SYMPTOMS  Mild dehydration  Thirst.  Dry lips.  Slightly dry mouth. Moderate dehydration  Very dry mouth.  Sunken eyes.  Skin does not bounce back quickly when lightly pinched and released.  Dark urine and decreased urine production.  Decreased tear production.  Headache. Severe dehydration  Very dry mouth.  Extreme thirst.  Rapid, weak pulse (more than 100 beats per minute at rest).  Cold hands and feet.  Not able to sweat in spite of heat and temperature.  Rapid breathing.  Blue lips.  Confusion and lethargy.  Difficulty being awakened.  Minimal urine production.  No tears. DIAGNOSIS  Your caregiver will diagnose dehydration based on your symptoms and your exam. Blood and urine tests will help confirm the diagnosis. The diagnostic evaluation should also identify the cause of dehydration. TREATMENT  Treatment of mild or moderate dehydration can often be done at home by increasing the amount of fluids that you drink. It is best to drink small amounts of fluid more often. Drinking too much at one time can make vomiting worse. Refer to the home care instructions below. Severe dehydration needs to be treated at the hospital where you will probably be given intravenous (IV) fluids that contain water and electrolytes. HOME CARE INSTRUCTIONS   Ask your caregiver about specific rehydration instructions.  Drink enough fluids to keep your urine clear or pale yellow.  Drink small amounts frequently if you have nausea and vomiting.  Eat as you normally do.  Avoid:  Foods or drinks high in sugar.  Carbonated  drinks.  Juice.  Extremely hot or cold fluids.  Drinks with caffeine.  Fatty, greasy foods.  Alcohol.  Tobacco.  Overeating.  Gelatin desserts.  Wash your hands well to avoid spreading bacteria and viruses.  Only take over-the-counter or prescription medicines for pain, discomfort, or fever as directed by your caregiver.  Ask your caregiver if you should continue all prescribed and over-the-counter medicines.  Keep all follow-up appointments with your caregiver. SEEK MEDICAL CARE IF:  You have abdominal pain and it increases or stays in one area (localizes).  You have a rash, stiff neck, or severe headache.  You are irritable, sleepy, or difficult to awaken.  You are weak, dizzy, or extremely thirsty. SEEK IMMEDIATE MEDICAL CARE IF:   You are unable to keep fluids down or you get worse despite treatment.  You have frequent episodes of vomiting or diarrhea.  You have blood or green matter (bile) in your vomit.  You have blood in your stool or your stool looks black and tarry.  You have not urinated in 6 to 8 hours, or you have only urinated a small amount of very dark urine.  You have a fever.  You faint. MAKE SURE YOU:   Understand these instructions.  Will watch your condition.  Will get help right away if you are not doing well or get worse. Document Released: 12/18/2004 Document Revised: 03/12/2011 Document Reviewed: 08/07/2010 [facility] Patient Information 2015 [place], [place]. This information is not intended to replace advice given to you by your health care provider. Make sure you discuss any questions you have with your health care  provider.  Diarrhea Diarrhea is frequent loose and watery bowel movements. It can cause you to feel weak and dehydrated. Dehydration can cause you to become tired and thirsty, have a dry mouth, and have decreased urination that often is dark yellow. Diarrhea is a sign of another problem, most often an infection that will  not last long. In most cases, diarrhea typically lasts 2-3 days. However, it can last longer if it is a sign of something more serious. It is important to treat your diarrhea as directed by your caregiver to lessen or prevent future episodes of diarrhea. CAUSES  Some common causes include:  Gastrointestinal infections caused by viruses, bacteria, or parasites.  Food poisoning or food allergies.  Certain medicines, such as antibiotics, chemotherapy, and laxatives.  Artificial sweeteners and fructose.  Digestive disorders. HOME CARE INSTRUCTIONS  Ensure adequate fluid intake (hydration): Have 1 cup (8 oz) of fluid for each diarrhea episode. Avoid fluids that contain simple sugars or sports drinks, fruit juices, whole milk products, and sodas. Your urine should be clear or pale yellow if you are drinking enough fluids. Hydrate with an oral rehydration solution that you can purchase at pharmacies, retail stores, and online. You can prepare an oral rehydration solution at home by mixing the following ingredients together:   - tsp table salt.   tsp baking soda.   tsp salt substitute containing potassium chloride.  1  tablespoons sugar.  1 L (34 oz) of water.  Certain foods and beverages may increase the speed at which food moves through the gastrointestinal (GI) tract. These foods and beverages should be avoided and include:  Caffeinated and alcoholic beverages.  High-fiber foods, such as raw fruits and vegetables, nuts, seeds, and whole grain breads and cereals.  Foods and beverages sweetened with sugar alcohols, such as xylitol, sorbitol, and mannitol.  Some foods may be well tolerated and may help thicken stool including:  Starchy foods, such as rice, toast, pasta, low-sugar cereal, oatmeal, grits, baked potatoes, crackers, and bagels.  Bananas.  Applesauce.  Add probiotic-rich foods to help increase healthy bacteria in the GI tract, such as yogurt and fermented milk  products.  Wash your hands well after each diarrhea episode.  Only take over-the-counter or prescription medicines as directed by your caregiver.  Take a warm bath to relieve any burning or pain from frequent diarrhea episodes. SEEK IMMEDIATE MEDICAL CARE IF:   You are unable to keep fluids down.  You have persistent vomiting.  You have blood in your stool, or your stools are black and tarry.  You do not urinate in 6-8 hours, or there is only a small amount of very dark urine.  You have abdominal pain that increases or localizes.  You have weakness, dizziness, confusion, or light-headedness.  You have a severe headache.  Your diarrhea gets worse or does not get better.  You have a fever or persistent symptoms for more than 2-3 days.  You have a fever and your symptoms suddenly get worse. MAKE SURE YOU:   Understand these instructions.  Will watch your condition.  Will get help right away if you are not doing well or get worse. Document Released: 12/08/2001 Document Revised: 05/04/2013 Document Reviewed: 08/26/2011 [facility] Patient Information 2015 [place], [place]. This information is not intended to replace advice given to you by your health care provider. Make sure you discuss any questions you have with your health care provider.  Nausea and Vomiting Nausea is a sick feeling that often comes before throwing  up (vomiting). Vomiting is a reflex where stomach contents come out of your mouth. Vomiting can cause severe loss of body fluids (dehydration). Children and elderly adults can become dehydrated quickly, especially if they also have diarrhea. Nausea and vomiting are symptoms of a condition or disease. It is important to find the cause of your symptoms. CAUSES   Direct irritation of the stomach lining. This irritation can result from increased acid production (gastroesophageal reflux disease), infection, food poisoning, taking certain medicines (such as nonsteroidal  anti-inflammatory drugs), alcohol use, or tobacco use.  Signals from the brain.These signals could be caused by a headache, heat exposure, an inner ear disturbance, increased pressure in the brain from injury, infection, a tumor, or a concussion, pain, emotional stimulus, or metabolic problems.  An obstruction in the gastrointestinal tract (bowel obstruction).  Illnesses such as diabetes, hepatitis, gallbladder problems, appendicitis, kidney problems, cancer, sepsis, atypical symptoms of a heart attack, or eating disorders.  Medical treatments such as chemotherapy and radiation.  Receiving medicine that makes you sleep (general anesthetic) during surgery. DIAGNOSIS Your caregiver may ask for tests to be done if the problems do not improve after a few days. Tests may also be done if symptoms are severe or if the reason for the nausea and vomiting is not clear. Tests may include:  Urine tests.  Blood tests.  Stool tests.  Cultures (to look for evidence of infection).  X-rays or other imaging studies. Test results can help your caregiver make decisions about treatment or the need for additional tests. TREATMENT You need to stay well hydrated. Drink frequently but in small amounts.You may wish to drink water, sports drinks, clear broth, or eat frozen ice pops or gelatin dessert to help stay hydrated.When you eat, eating slowly may help prevent nausea.There are also some antinausea medicines that may help prevent nausea. HOME CARE INSTRUCTIONS   Take all medicine as directed by your caregiver.  If you do not have an appetite, do not force yourself to eat. However, you must continue to drink fluids.  If you have an appetite, eat a normal diet unless your caregiver tells you differently.  Eat a variety of complex carbohydrates (rice, wheat, potatoes, bread), lean meats, yogurt, fruits, and vegetables.  Avoid high-fat foods because they are more difficult to digest.  Drink enough  water and fluids to keep your urine clear or pale yellow.  If you are dehydrated, ask your caregiver for specific rehydration instructions. Signs of dehydration may include:  Severe thirst.  Dry lips and mouth.  Dizziness.  Dark urine.  Decreasing urine frequency and amount.  Confusion.  Rapid breathing or pulse. SEEK IMMEDIATE MEDICAL CARE IF:   You have blood or brown flecks (like coffee grounds) in your vomit.  You have black or bloody stools.  You have a severe headache or stiff neck.  You are confused.  You have severe abdominal pain.  You have chest pain or trouble breathing.  You do not urinate at least once every 8 hours.  You develop cold or clammy skin.  You continue to vomit for longer than 24 to 48 hours.  You have a fever. MAKE SURE YOU:   Understand these instructions.  Will watch your condition.  Will get help right away if you are not doing well or get worse. Document Released: 12/18/2004 Document Revised: 03/12/2011 Document Reviewed: 05/17/2010 [facility] Patient Information 2015 [place]. This information is not intended to replace advice given to you by your health care provider. Make  sure you discuss any questions you have with your health care provider.  Viral Infections A viral infection can be caused by different types of viruses.Most viral infections are not serious and resolve on their own. However, some infections may cause severe symptoms and may lead to further complications. SYMPTOMS Viruses can frequently cause:  Minor sore throat.  Aches and pains.  Headaches.  Runny nose.  Different types of rashes.  Watery eyes.  Tiredness.  Cough.  Loss of appetite.  Gastrointestinal infections, resulting in nausea, vomiting, and diarrhea. These symptoms do not respond to antibiotics because the infection is not caused by bacteria. However, you might catch a bacterial infection following the viral infection. This is  sometimes called a "superinfection." Symptoms of such a bacterial infection may include:  Worsening sore throat with pus and difficulty swallowing.  Swollen neck glands.  Chills and a high or persistent fever.  Severe headache.  Tenderness over the sinuses.  Persistent overall ill feeling (malaise), muscle aches, and tiredness (fatigue).  Persistent cough.  Yellow, green, or brown mucus production with coughing. HOME CARE INSTRUCTIONS   Only take over-the-counter or prescription medicines for pain, discomfort, diarrhea, or fever as directed by your caregiver.  Drink enough water and fluids to keep your urine clear or pale yellow. Sports drinks can provide valuable electrolytes, sugars, and hydration.  Get plenty of rest and maintain proper nutrition. Soups and broths with crackers or rice are fine. SEEK IMMEDIATE MEDICAL CARE IF:   You have severe headaches, shortness of breath, chest pain, neck pain, or an unusual rash.  You have uncontrolled vomiting, diarrhea, or you are unable to keep down fluids.  You or your child has an oral temperature above 102 F (38.9 C), not controlled by medicine.  Your baby is older than 3 months with a rectal temperature of 102 F (38.9 C) or higher.  Your baby is 60 months old or younger with a rectal temperature of 100.4 F (38 C) or higher. MAKE SURE YOU:   Understand these instructions.  Will watch your condition.  Will get help right away if you are not doing well or get worse. Document Released: 09/27/2004 Document Revised: 03/12/2011 Document Reviewed: 04/24/2010 [facility] Patient Information 2015 [place], [place]. This information is not intended to replace advice given to you by your health care provider. Make sure you discuss any questions you have with your health care provider.

## 2014-07-12 LAB — URINE CULTURE

## 2014-07-13 LAB — CULTURE, GROUP A STREP (THRC)

## 2014-07-14 NOTE — ED Notes (Signed)
Lab called to report gram positive cocci in blood culture bottle. Will notify EDP for orders.

## 2014-07-14 NOTE — ED Provider Notes (Signed)
-----------------------------------------   9:06 PM on 07/14/2014 -----------------------------------------  Patient had one positive blood culture growing gram-positive cocci. His throat culture also appeared to have grown group C streptococcus. We have called the patient to have him come back to the emergency department for reevaluation, recheck of blood cultures, and likely antibiotics for his strep throat infection. The patient is unable to come to the emergency department tonight, but states he can come first thing in the morning. Patient denies any fever at home but states he continues to have general fatigue.  Minna AntisKevin Dondi Aime, MD 07/14/14 2107

## 2014-07-14 NOTE — ED Notes (Signed)
Called Peter Sparks and asked him to come back per the EDP to receive two new sets of blood cultures and antibiotics. Patient stated he would come in early in the morning.

## 2014-07-15 LAB — CULTURE, BLOOD (ROUTINE X 2): Culture: NO GROWTH

## 2014-07-16 LAB — CULTURE, BLOOD (ROUTINE X 2)

## 2016-08-31 IMAGING — CR DG CHEST 1V PORT
1 series · 1 of 1 positions shown · non-contrast
Comparison: 08/03/2011

CLINICAL DATA: Fever

EXAM:
PORTABLE CHEST - 1 VIEW

[portable]
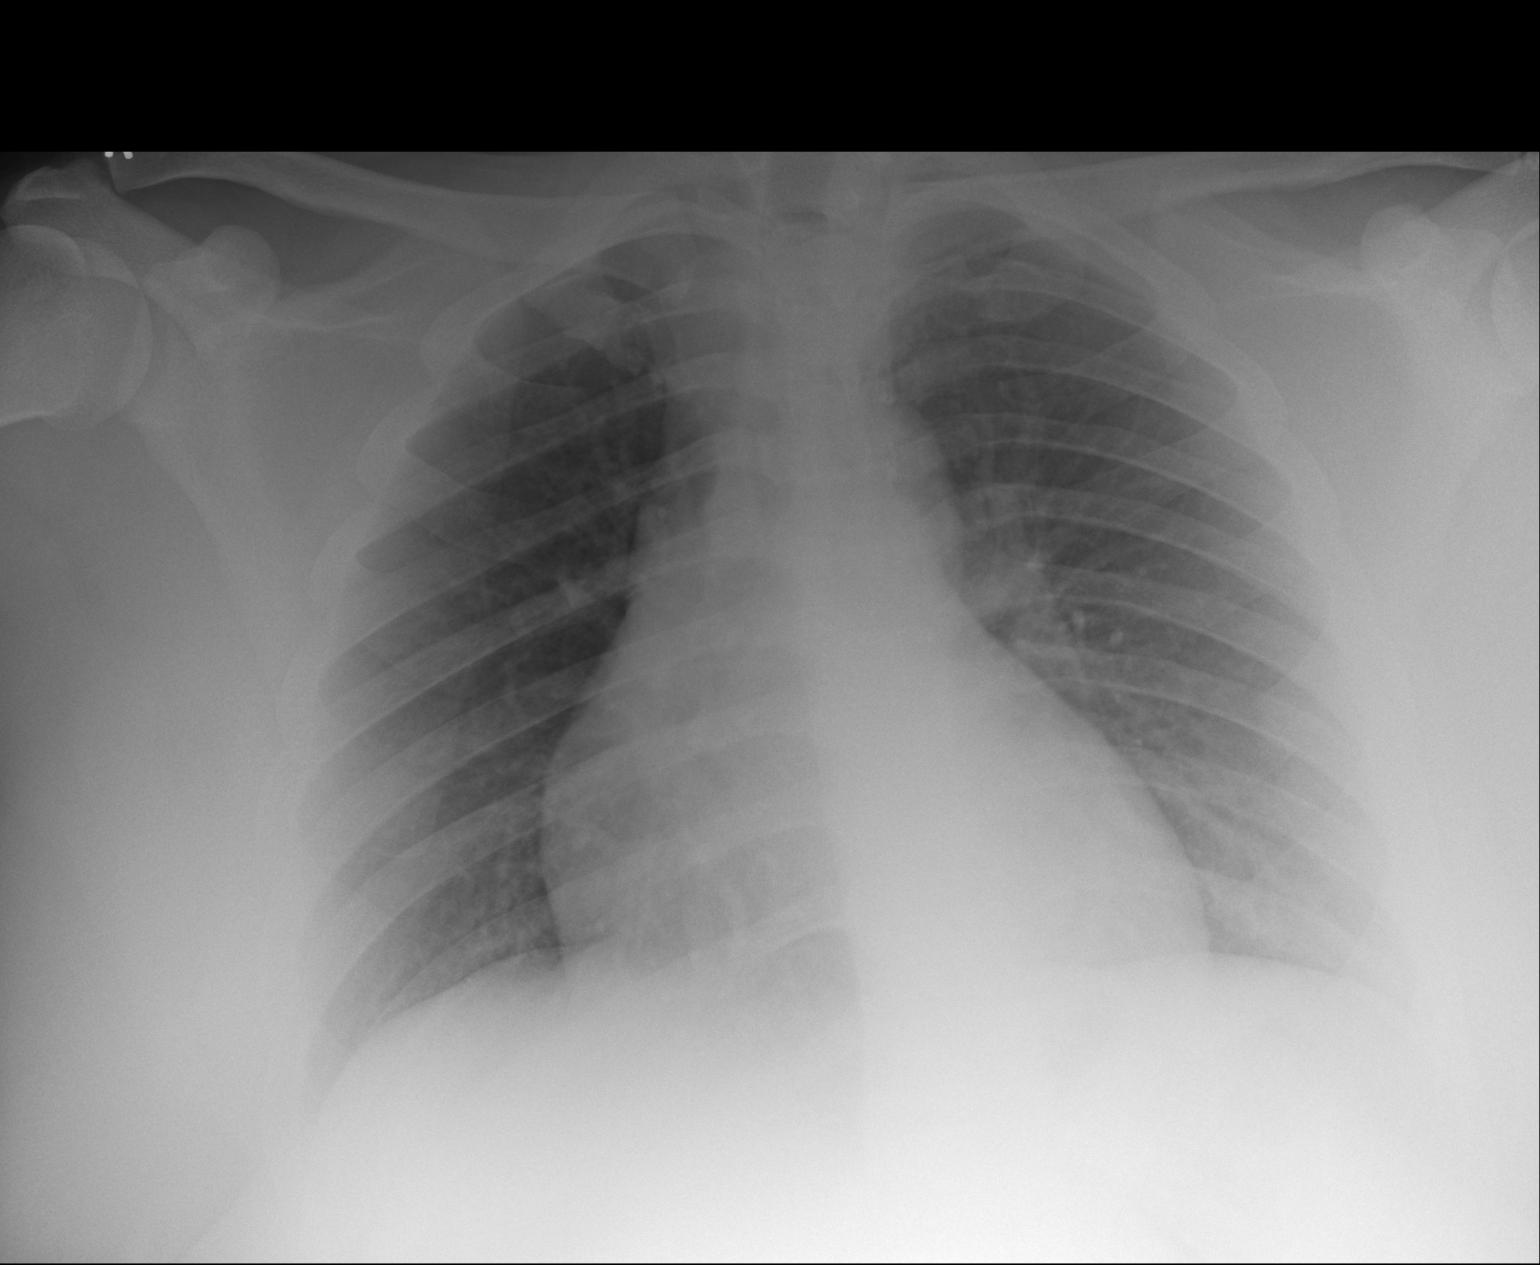

[1 of 1 positions shown; findings below may reference images not displayed]

FINDINGS: Study sensitivity limited by rotation, portable technique, and
patient size. Normal heart size and mediastinal contours. No
definite acute infiltrate. No effusion or pneumothorax. No acute
osseous findings.
IMPRESSION: No definitive pneumonia, but limited at the bases.

## 2016-09-01 IMAGING — CT CT ABD-PELV W/ CM
2 of 5 series · 17 of 46 positions shown, 19 images · IV contrast (omnipaque)
Comparison: None.

CLINICAL DATA: Acute onset of vomiting, diarrhea, chills, fever and
sore throat. Initial encounter.

EXAM:
CT ABDOMEN AND PELVIS WITH CONTRAST
TECHNIQUE: Multidetector CT imaging of the abdomen and pelvis was performed
using the standard protocol following bolus administration of
intravenous contrast.
CONTRAST:  125mL OMNIPAQUE IOHEXOL 350 MG/ML SOLN

[Series 2: routine abd pel with · axial · 0.89mm/px · z∈[-934,-509]mm · 14 of 95 slices shown, 16 images]
[im 5/95  soft-tissue]
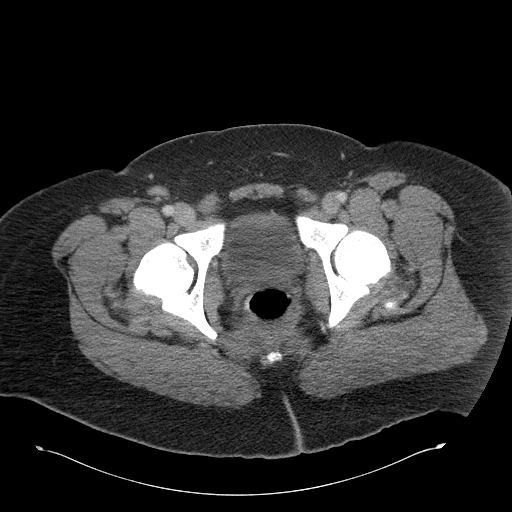
[im 5/95  bone]
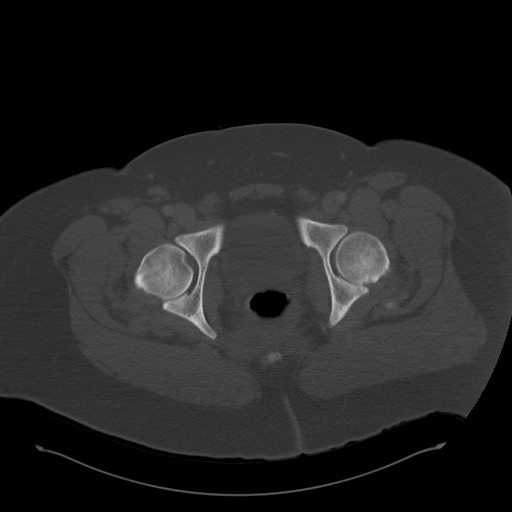
[im 14/95  soft-tissue]
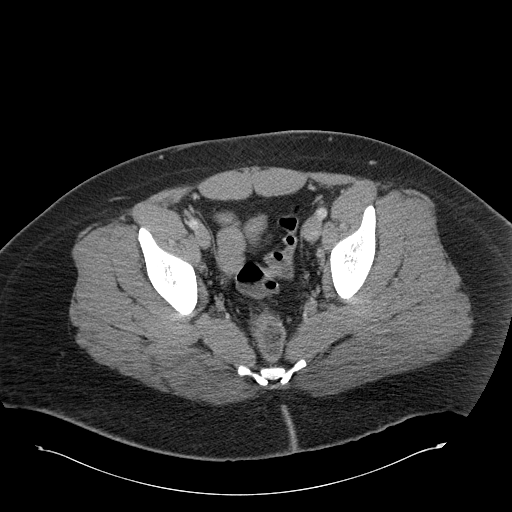
[im 18/95  soft-tissue]
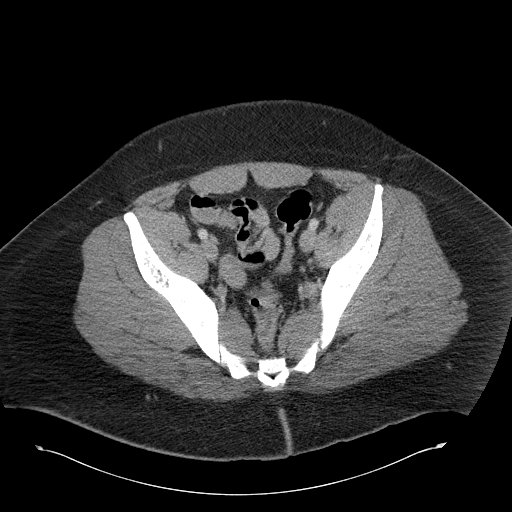
[im 27/95  soft-tissue]
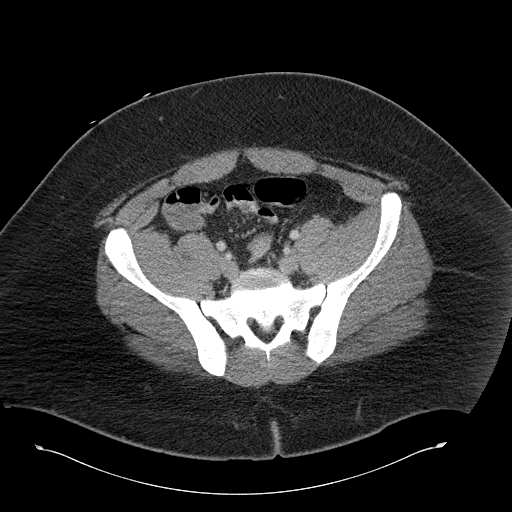
[im 32/95  soft-tissue]
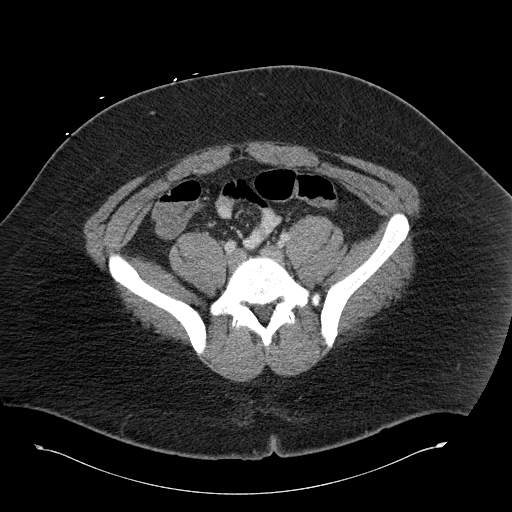
[im 36/95  soft-tissue]
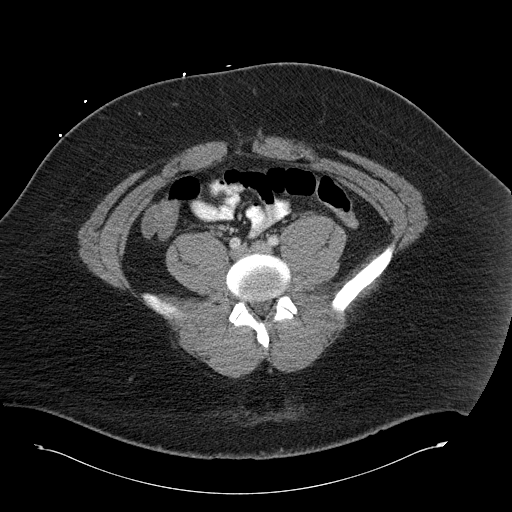
[im 45/95  soft-tissue]
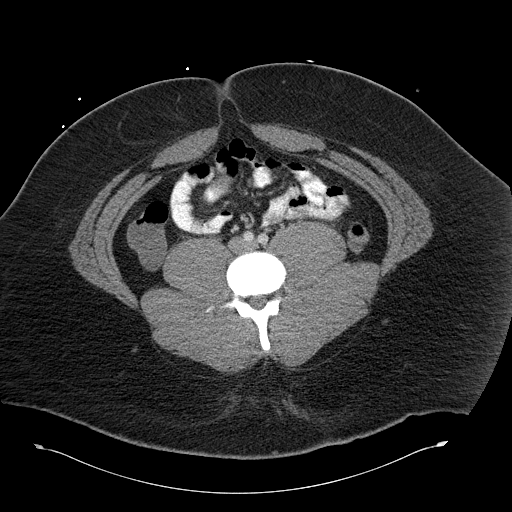
[im 50/95  soft-tissue]
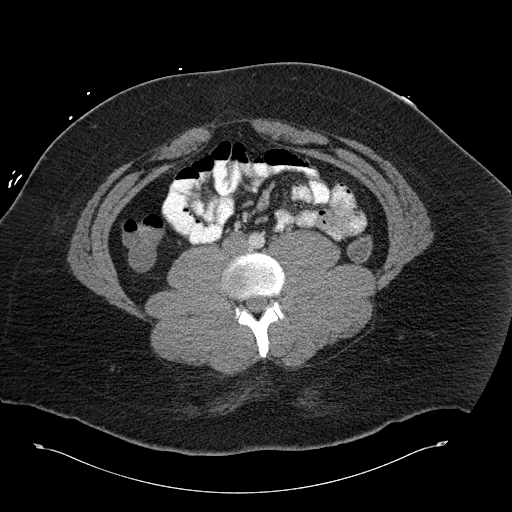
[im 59/95  soft-tissue]
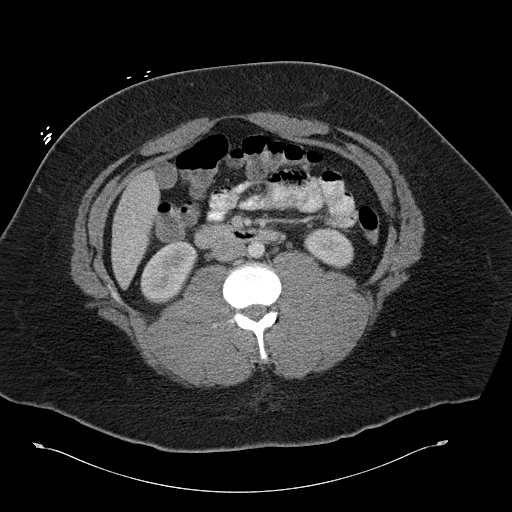
[im 59/95  bone]
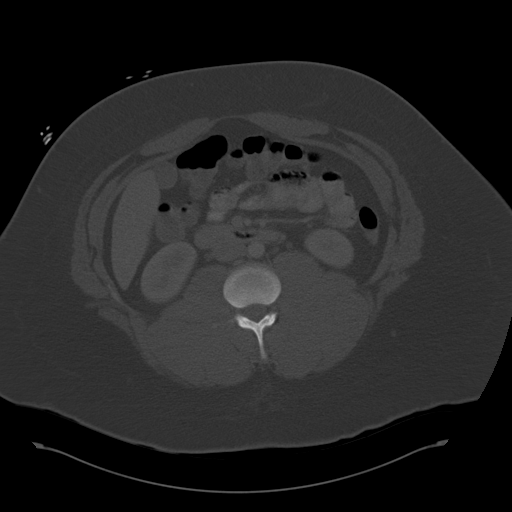
[im 63/95  soft-tissue]
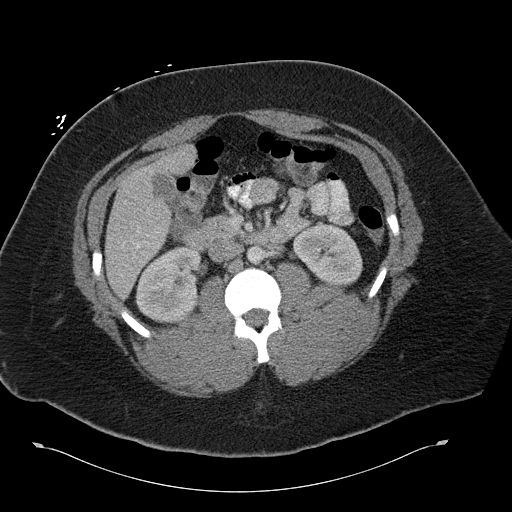
[im 72/95  soft-tissue]
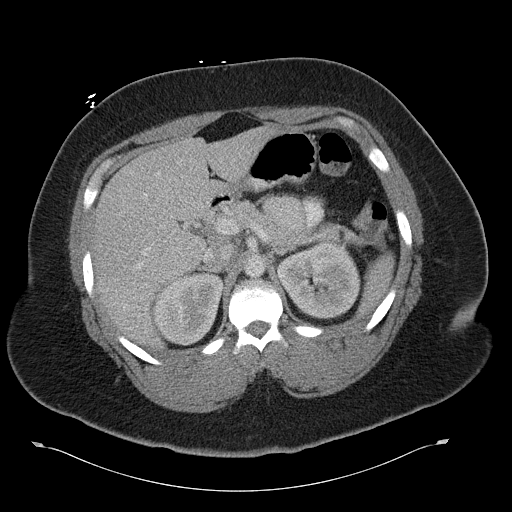
[im 77/95  soft-tissue]
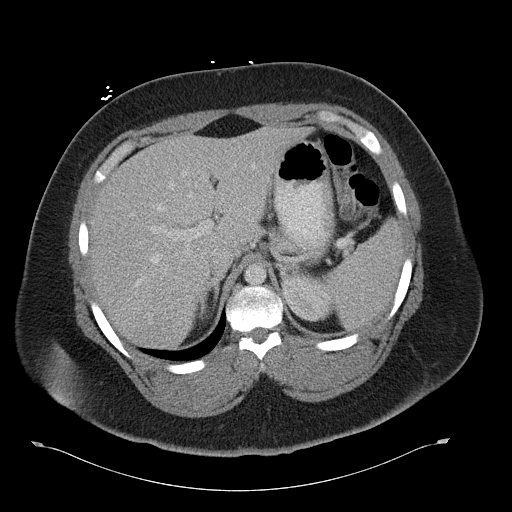
[im 81/95  soft-tissue]
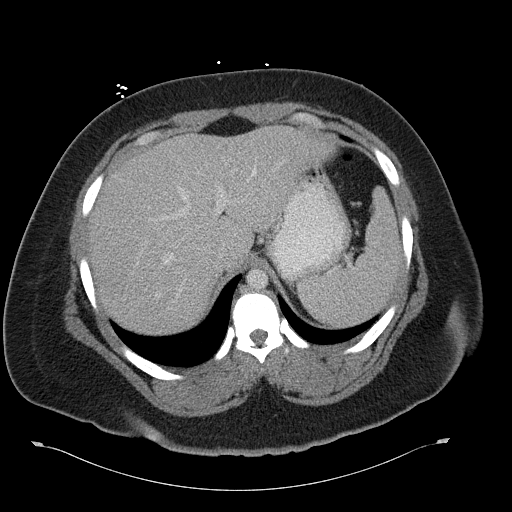
[im 90/95  soft-tissue]
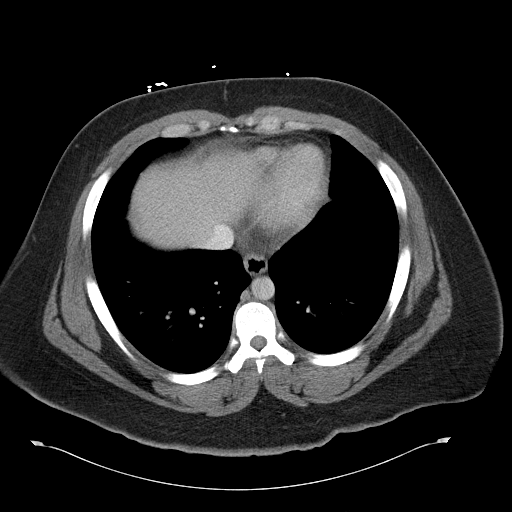

[Series 7: cor routine abd pel with · coronal · 0.84mm/px · 3 of 165 slices shown]
[im 55/165  soft-tissue]
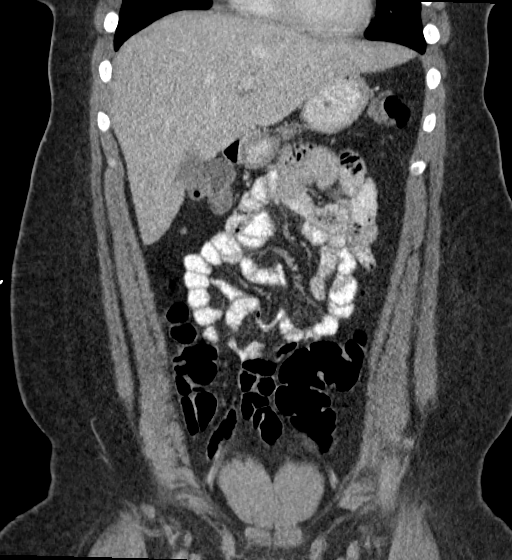
[im 73/165  soft-tissue]
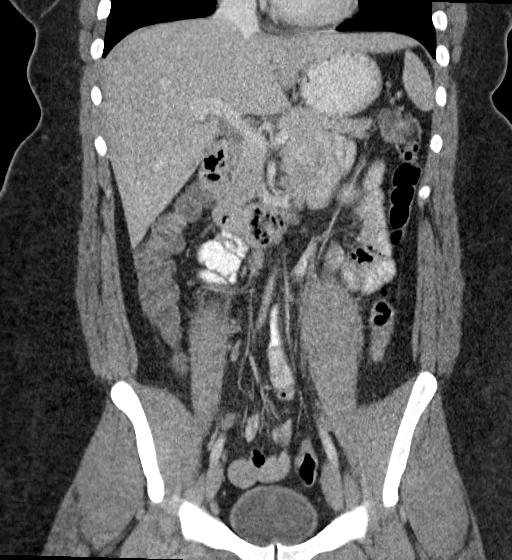
[im 92/165  soft-tissue]
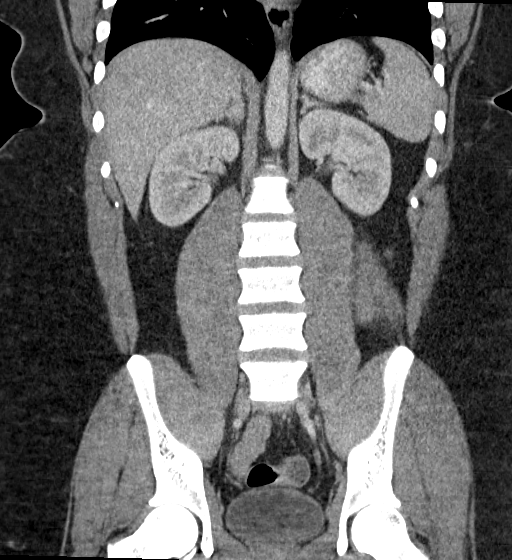

[17 of 46 positions shown; findings below may reference images not displayed]

FINDINGS: The visualized lung bases are clear.

The liver and spleen are unremarkable in appearance. The gallbladder
is within normal limits. The pancreas and adrenal glands are
unremarkable.

The kidneys are unremarkable in appearance. There is no evidence of
hydronephrosis. No renal or ureteral stones are seen. No perinephric
stranding is appreciated.

No free fluid is identified. The small bowel is unremarkable in
appearance. The stomach is within normal limits. No acute vascular
abnormalities are seen.

The appendix is normal in caliber, without evidence of appendicitis.
The colon is unremarkable in appearance.

The bladder is mildly distended and grossly unremarkable. The
prostate remains normal in size. No inguinal lymphadenopathy is
seen.

No acute osseous abnormalities are identified. There is incomplete
fusion of the posterior spinous process of L5.
IMPRESSION: Unremarkable contrast-enhanced CT of the abdomen and pelvis.

## 2019-07-11 ENCOUNTER — Other Ambulatory Visit: Payer: Self-pay

## 2019-07-11 ENCOUNTER — Emergency Department
Admission: EM | Admit: 2019-07-11 | Discharge: 2019-07-11 | Disposition: A | Discharge status: Home / Self Care | Payer: Self-pay | Attending: Emergency Medicine | Admitting: Emergency Medicine

## 2019-07-11 DIAGNOSIS — N342 Other urethritis: Secondary | ICD-10-CM | POA: Insufficient documentation

## 2019-07-11 DIAGNOSIS — F17200 Nicotine dependence, unspecified, uncomplicated: Secondary | ICD-10-CM | POA: Insufficient documentation

## 2019-07-11 LAB — URINALYSIS, COMPLETE (UACMP) WITH MICROSCOPIC
Bacteria, UA: NONE SEEN
Bilirubin Urine: NEGATIVE
Glucose, UA: NEGATIVE mg/dL
Hgb urine dipstick: NEGATIVE
Ketones, ur: NEGATIVE mg/dL
Nitrite: NEGATIVE
Protein, ur: NEGATIVE mg/dL
Specific Gravity, Urine: 1.023 (ref 1.005–1.030)
pH: 6 (ref 5.0–8.0)

## 2019-07-11 LAB — CHLAMYDIA/NGC RT PCR (ARMC ONLY)
Chlamydia Tr: NOT DETECTED
N gonorrhoeae: NOT DETECTED

## 2019-07-11 MED ORDER — CEFTRIAXONE SODIUM 1 G IJ SOLR
1.0000 g | Freq: Once | INTRAMUSCULAR | Status: AC
  Administered 2019-07-11: 1 g via INTRAMUSCULAR
  Filled 2019-07-11: qty 10

## 2019-07-11 MED ORDER — DOXYCYCLINE MONOHYDRATE 100 MG PO TABS: 100.0000 mg | ORAL_TABLET | Freq: Two times a day (BID) | ORAL | 0 refills | Status: DC

## 2019-07-11 MED ORDER — METRONIDAZOLE 500 MG PO TABS
2000.0000 mg | ORAL_TABLET | Freq: Once | ORAL | Status: AC
  Administered 2019-07-11: 2000 mg via ORAL
  Filled 2019-07-11: qty 4

## 2019-07-11 MED ORDER — AZITHROMYCIN 500 MG PO TABS
1000.0000 mg | ORAL_TABLET | Freq: Once | ORAL | Status: AC
  Administered 2019-07-11: 1000 mg via ORAL
  Filled 2019-07-11: qty 2

## 2019-07-11 MED ORDER — LIDOCAINE HCL (PF) 1 % IJ SOLN
2.1000 mL | Freq: Once | INTRAMUSCULAR | Status: AC
  Administered 2019-07-11: 2.1 mL
  Filled 2019-07-11: qty 5

## 2019-07-11 MED ORDER — ONDANSETRON 8 MG PO TBDP
8.0000 mg | ORAL_TABLET | Freq: Once | ORAL | Status: AC
  Administered 2019-07-11: 8 mg via ORAL
  Filled 2019-07-11: qty 1

## 2019-07-11 NOTE — ED Triage Notes (Addendum)
Pt arrives via POV from home for reports intermittent dyrsuria x 1 month. Pt states at times during urination he has a sharp pain in his privates. Denies hematuria. Pt also reports a white sore in the right side of his mouth x 1 month accompanied with sore throat. PT ambulatory from triage in NAD, skin warm and dry.

## 2019-07-11 NOTE — Discharge Instructions (Addendum)
Please avoid sexual intercourse for 1 week.  Return to the ER for any worsening symptoms or urgent changes in your health.

## 2019-07-11 NOTE — ED Provider Notes (Addendum)
[facility] EMERGENCY DEPARTMENT Provider Note   CSN: [id number] Arrival date & time: 07/11/19  1432     History Chief Complaint  Patient presents with   Dysuria    Peter Sparks is a 34 y.o. male presents to the emergency department for evaluation of intermittent episodes of dysuria and increase in urinary frequency x1 month.  Patient did have unprotected sex with new sexual partner 1 month ago.  Patient denies any rashes, does have a single canker sore in his right oral mucosa that has been present for several weeks, no longer painful.  He denies any penile discharge, scrotal discomfort, abdominal pain, fevers, hematuria, nausea or vomiting or back pain.  Denies any canker lesions on the penis. HPI     History reviewed. No pertinent past medical history.  Patient Active Problem List   Diagnosis Date Noted   Depression, major, single episode, mild (HCC) 06/08/2014   Cannabis abuse 06/08/2014   PTSD (post-traumatic stress disorder) 06/08/2014    History reviewed. No pertinent surgical history.     History reviewed. No pertinent family history.  Social History   Tobacco Use   Smoking status: Current Every Day Smoker  Substance Use Topics   Alcohol use: Yes   Drug use: Yes    Types: Marijuana    Comment: Molly    Home Medications Prior to Admission medications   Medication Sig Start Date End Date Taking? Authorizing Provider  ondansetron (ZOFRAN ODT) 4 MG disintegrating tablet Take 1 tablet (4 mg total) by mouth every 8 (eight) hours as needed for nausea or vomiting. 07/11/14   Rebecka Apley, MD    Allergies    Patient has no known allergies.  Review of Systems   Review of Systems  Constitutional: Negative for chills and fever.  Respiratory: Negative for shortness of breath.   Cardiovascular: Negative for chest pain.  Gastrointestinal: Negative for diarrhea, nausea and vomiting.  Genitourinary: Positive for dysuria and  frequency. Negative for discharge, genital sores, hematuria and penile pain.    Physical Exam Updated Vital Signs BP (!) 154/98 (BP Location: Right Arm)    Pulse 67    Temp 98.2 F (36.8 C) (Oral)    Resp 18    Ht 6\' 1"  (1.854 m)    Wt (!) 163.3 kg    SpO2 100%    BMI 47.50 kg/m   Physical Exam Constitutional:      Appearance: He is well-developed.  HENT:     Head: Normocephalic and atraumatic.     Mouth/Throat:     Mouth: Mucous membranes are moist.     Pharynx: No oropharyngeal exudate or posterior oropharyngeal erythema.     Comments: Single shallow ulceration on the right intraoral mucosa of the cheek.  1 cm diameter.  Nontender palpation.  No other oral lesions. Eyes:     Conjunctiva/sclera: Conjunctivae normal.  Cardiovascular:     Rate and Rhythm: Normal rate.  Pulmonary:     Effort: Pulmonary effort is normal. No respiratory distress.  Genitourinary:    Penis: Normal.      Testes: Normal.  Musculoskeletal:        General: Normal range of motion.     Cervical back: Normal range of motion.  Skin:    General: Skin is warm.     Findings: No rash.     Comments: No rash throughout the body hands or feet.  Neurological:     General: No focal deficit present.  Mental Status: He is alert and oriented to person, place, and time.  Psychiatric:        Behavior: Behavior normal.        Thought Content: Thought content normal.     ED Results / Procedures / Treatments   Labs (all labs ordered are listed, but only abnormal results are displayed) Labs Reviewed  URINALYSIS, COMPLETE (UACMP) WITH MICROSCOPIC - Abnormal; Notable for the following components:      Result Value   Color, Urine YELLOW (*)    APPearance HAZY (*)    Leukocytes,Ua TRACE (*)    All other components within normal limits  CHLAMYDIA/NGC RT PCR ([facility] ONLY)    EKG None  Radiology No results found.  Procedures Procedures (including critical care time)  Medications Ordered in  ED Medications  metroNIDAZOLE (FLAGYL) tablet 2,000 mg (has no administration in time range)  azithromycin (ZITHROMAX) tablet 1,000 mg (1,000 mg Oral Given 07/11/19 1555)  cefTRIAXone (ROCEPHIN) injection 1 g (1 g Intramuscular Given 07/11/19 1557)  lidocaine (PF) (XYLOCAINE) 1 % injection 2.1 mL (2.1 mLs Other Given 07/11/19 1559)  ondansetron (ZOFRAN-ODT) disintegrating tablet 8 mg (8 mg Oral Given 07/11/19 1556)    ED Course  I have reviewed the triage vital signs and the nursing notes.  Pertinent labs & imaging results that were available during my care of the patient were reviewed by me and considered in my medical decision making (see chart for details).    MDM Rules/Calculators/A&P                          34 year old male with urethritis.  Urinalysis shows slight elevation in WBCs and leukocytes.  Patient treated with ceftriaxone and azithromycin as well as metronidazole to cover for any possible trichomonas.  Patient understands signs and symptoms return to the ER for. Final Clinical Impression(s) / ED Diagnoses Final diagnoses:  Urethritis     Evon Slack, PA-C 07/11/19 1655    Ronnette Juniper 07/11/19 1656    Sharman Cheek, MD 07/11/19 2312

## 2019-07-22 ENCOUNTER — Emergency Department
Admission: EM | Admit: 2019-07-22 | Discharge: 2019-07-22 | Disposition: A | Discharge status: Home / Self Care | Payer: Self-pay | Attending: Emergency Medicine | Admitting: Emergency Medicine

## 2019-07-22 ENCOUNTER — Encounter: Payer: Self-pay | Admitting: *Deleted

## 2019-07-22 ENCOUNTER — Other Ambulatory Visit: Payer: Self-pay

## 2019-07-22 DIAGNOSIS — F17200 Nicotine dependence, unspecified, uncomplicated: Secondary | ICD-10-CM | POA: Insufficient documentation

## 2019-07-22 DIAGNOSIS — B2790 Infectious mononucleosis, unspecified without complication: Secondary | ICD-10-CM | POA: Insufficient documentation

## 2019-07-22 LAB — CBC WITH DIFFERENTIAL/PLATELET
Abs Immature Granulocytes: 0.01 10*3/uL (ref 0.00–0.07)
Basophils Absolute: 0.1 10*3/uL (ref 0.0–0.1)
Basophils Relative: 1 %
Eosinophils Absolute: 0.5 10*3/uL (ref 0.0–0.5)
Eosinophils Relative: 6 %
HCT: 43.1 % (ref 39.0–52.0)
Hemoglobin: 13.9 g/dL (ref 13.0–17.0)
Immature Granulocytes: 0 %
Lymphocytes Relative: 45 %
Lymphs Abs: 3.4 10*3/uL (ref 0.7–4.0)
MCH: 27.7 pg (ref 26.0–34.0)
MCHC: 32.3 g/dL (ref 30.0–36.0)
MCV: 85.9 fL (ref 80.0–100.0)
Monocytes Absolute: 0.4 10*3/uL (ref 0.1–1.0)
Monocytes Relative: 5 %
Neutro Abs: 3.3 10*3/uL (ref 1.7–7.7)
Neutrophils Relative %: 43 %
Platelets: 327 10*3/uL (ref 150–400)
RBC: 5.02 MIL/uL (ref 4.22–5.81)
RDW: 13.2 % (ref 11.5–15.5)
WBC: 7.7 10*3/uL (ref 4.0–10.5)
nRBC: 0 % (ref 0.0–0.2)

## 2019-07-22 LAB — BASIC METABOLIC PANEL
Anion gap: 7 (ref 5–15)
BUN: 11 mg/dL (ref 6–20)
CO2: 26 mmol/L (ref 22–32)
Calcium: 9.1 mg/dL (ref 8.9–10.3)
Chloride: 104 mmol/L (ref 98–111)
Creatinine, Ser: 1.19 mg/dL (ref 0.61–1.24)
GFR calc Af Amer: >60 mL/min (ref >60)
GFR calc non Af Amer: >60 mL/min (ref >60)
Glucose, Bld: 91 mg/dL (ref 70–99)
Potassium: 4.2 mmol/L (ref 3.5–5.1)
Sodium: 137 mmol/L (ref 135–145)

## 2019-07-22 MED ORDER — METOCLOPRAMIDE HCL 10 MG PO TABS
10.0000 mg | ORAL_TABLET | Freq: Once | ORAL | Status: AC
  Administered 2019-07-22: 10 mg via ORAL
  Filled 2019-07-22: qty 1

## 2019-07-22 MED ORDER — ONDANSETRON 4 MG PO TBDP: 4.0000 mg | ORAL_TABLET | Freq: Three times a day (TID) | ORAL | 0 refills | Status: DC | PRN

## 2019-07-22 MED ORDER — ACETAMINOPHEN 325 MG PO TABS
650.0000 mg | ORAL_TABLET | Freq: Once | ORAL | Status: AC
  Administered 2019-07-22: 650 mg via ORAL
  Filled 2019-07-22: qty 2

## 2019-07-22 MED ORDER — SODIUM CHLORIDE 0.9 % IV BOLUS
1000.0000 mL | Freq: Once | INTRAVENOUS | Status: AC
  Administered 2019-07-22: 1000 mL via INTRAVENOUS

## 2019-07-22 MED ORDER — KETOROLAC TROMETHAMINE 30 MG/ML IJ SOLN
30.0000 mg | Freq: Once | INTRAMUSCULAR | Status: DC
  Filled 2019-07-22: qty 1

## 2019-07-22 MED ORDER — KETOROLAC TROMETHAMINE 30 MG/ML IJ SOLN
30.0000 mg | Freq: Once | INTRAMUSCULAR | Status: AC
  Administered 2019-07-22: 30 mg via INTRAVENOUS

## 2019-07-22 MED ORDER — KETOROLAC TROMETHAMINE 10 MG PO TABS: 10.0000 mg | ORAL_TABLET | Freq: Three times a day (TID) | ORAL | 0 refills | Status: DC

## 2019-07-22 NOTE — ED Triage Notes (Signed)
Pt reports pressure in his head, like someone is holding him upside down.  Pt was seen here last week with similar sx.   Nausea  Today. Pt alert   Speech clear.  Pt ambulating without diff.

## 2019-07-22 NOTE — ED Provider Notes (Signed)
[facility] Emergency Department Provider Note ____________________________________________  Time seen: 1635  I have reviewed the triage vital signs and the nursing notes.  HISTORY  Chief Complaint  Headache  HPI Peter Sparks is a 34 y.o. male with a recent diagnosis of mononucleosis, presents to the ED with generalized complaints of myalgias, fatigue, and headache.  He also describes a loss of energy, and feeling like he is in a fog.  He denies any interim fever, chills, sweats.  Denies any frank sore throat pain, only describing some mild fullness to the throat.  Patient has been taking Tylenol and Motrin sparingly over the last few days.  He also reports decreased appetite and malaise.   No past medical history on file.  Patient Active Problem List   Diagnosis Date Noted   Depression, major, single episode, mild (HCC) 06/08/2014   Cannabis abuse 06/08/2014   PTSD (post-traumatic stress disorder) 06/08/2014    No past surgical history on file.  Prior to Admission medications   Medication Sig Start Date End Date Taking? Authorizing Provider  ketorolac (TORADOL) 10 MG tablet Take 1 tablet (10 mg total) by mouth every 8 (eight) hours. 07/22/19   Maryse Brierley, Charlesetta Ivory, PA-C  ondansetron (ZOFRAN ODT) 4 MG disintegrating tablet Take 1 tablet (4 mg total) by mouth every 8 (eight) hours as needed. 07/22/19   Linda Biehn, Charlesetta Ivory, PA-C    Allergies Patient has no known allergies.  No family history on file.  Social History Social History   Tobacco Use   Smoking status: Current Every Day Smoker   Smokeless tobacco: Never Used  Substance Use Topics   Alcohol use: Yes   Drug use: Yes    Types: Marijuana    Comment: Molly    Review of Systems  Constitutional: Negative for fever.  Reports malaise and fatigue. Eyes: Negative for visual changes. ENT: Negative for sore throat. Cardiovascular: Negative for chest pain. Respiratory: Negative  for shortness of breath. Gastrointestinal: Negative for abdominal pain, vomiting and diarrhea. Genitourinary: Negative for dysuria. Musculoskeletal: Negative for back pain.  Reports generalized myalgias. Skin: Negative for rash. Neurological: Negative for headaches, focal weakness or numbness. ____________________________________________  PHYSICAL EXAM:  VITAL SIGNS: ED Triage Vitals  Enc Vitals Group     BP 07/22/19 1543 132/75     Pulse Rate 07/22/19 1543 71     Resp 07/22/19 1543 20     Temp 07/22/19 1543 98.3 F (36.8 C)     Temp Source 07/22/19 1543 Oral     SpO2 07/22/19 1543 100 %     Weight 07/22/19 1541 (!) 345 lb (156.5 kg)     Height 07/22/19 1541 6\' 1"  (1.854 m)     Head Circumference --      Peak Flow --      Pain Score 07/22/19 1541 8     Pain Loc --      Pain Edu? --      Excl. in GC? --     Constitutional: Alert and oriented. Well appearing and in no distress. Head: Normocephalic and atraumatic. Eyes: Conjunctivae are normal. Normal extraocular movements Ears: Canals clear. TMs intact bilaterally. Nose: No congestion/rhinorrhea/epistaxis. Mouth/Throat: Mucous membranes are moist.  Uvula is midline tonsils are flat.  No oropharyngeal erythema is noted.  No oral lesions noted. Neck: Supple. No thyromegaly. Hematological/Lymphatic/Immunological: No cervical lymphadenopathy. Cardiovascular: Normal rate, regular rhythm. Normal distal pulses. Respiratory: Normal respiratory effort. No wheezes/rales/rhonchi. Gastrointestinal: Soft and nontender. No distention. Musculoskeletal:  Nontender with normal range of motion in all extremities.  Neurologic:  Normal gait without ataxia. Normal speech and language. No gross focal neurologic deficits are appreciated. Skin:  Skin is warm, dry and intact. No rash noted. ____________________________________________   LABS (pertinent positives/negatives) Labs Reviewed  BASIC METABOLIC PANEL  CBC WITH DIFFERENTIAL/PLATELET   ___________________________________________  PROCEDURES  Toradol 30 mg IVP Reglan 10 gm PO Tylenol 650 mg PO NS 1000 bolus  Procedures ____________________________________________  INITIAL IMPRESSION / ASSESSMENT AND PLAN / ED COURSE  Patient with ED evaluation generalized myalgias, malaise and fatigue, in the face of a recent diagnosis of mononucleosis.  Patient clinical exam is overall benign return extremities without signs of acute dehydration, respiratory distress, or toxic appearance.  Labs also reassuring at this time.  Patient was administered IV bolus of fluids as well as anti-inflammatory, nausea medicine and oral Tylenol dose.  He reports provement of symptoms overall, will continue the medications as prescribed.  He is also given advice again on the self-limited but sometimes protracted course related to a mono diagnosis.  Return precautions have been reviewed.  Peter Sparks was evaluated in Emergency Department on 07/22/2019 for the symptoms described in the history of present illness. He was evaluated in the context of the global COVID-19 pandemic, which necessitated consideration that the patient might be at risk for infection with the SARS-CoV-2 virus that causes COVID-19. Institutional protocols and algorithms that pertain to the evaluation of patients at risk for COVID-19 are in a state of rapid change based on information released by regulatory bodies including the CDC and federal and state organizations. These policies and algorithms were followed during the patient's care in the ED. ____________________________________________  FINAL CLINICAL IMPRESSION(S) / ED DIAGNOSES  Final diagnoses:  Infectious mononucleosis without complication, infectious mononucleosis due to unspecified organism      Shaunta Oncale, Charlesetta Ivory, PA-C 07/22/19 2227    Phineas Semen, MD 07/22/19 2308

## 2019-07-22 NOTE — Discharge Instructions (Signed)
Your exam and labs are normal at this time. You are experiencing symptoms consistent with mononucleosis. You may have muscle soreness, fatigue, and headaches for several weeks following your diagnosis. Follow-up with your provider or return as needed.

## 2023-03-19 ENCOUNTER — Emergency Department: Payer: Self-pay

## 2023-03-19 ENCOUNTER — Other Ambulatory Visit: Payer: Self-pay

## 2023-03-19 ENCOUNTER — Encounter: Payer: Self-pay | Admitting: Emergency Medicine

## 2023-03-19 ENCOUNTER — Inpatient Hospital Stay
Admission: EM | Admit: 2023-03-19 | Discharge: 2023-03-22 | DRG: 164 | Disposition: A | Discharge status: Home / Self Care | Payer: Self-pay | Attending: Internal Medicine | Admitting: Internal Medicine

## 2023-03-19 DIAGNOSIS — Z6841 Body Mass Index (BMI) 40.0 and over, adult: Secondary | ICD-10-CM

## 2023-03-19 DIAGNOSIS — R0902 Hypoxemia: Secondary | ICD-10-CM | POA: Diagnosis present

## 2023-03-19 DIAGNOSIS — F17200 Nicotine dependence, unspecified, uncomplicated: Secondary | ICD-10-CM | POA: Insufficient documentation

## 2023-03-19 DIAGNOSIS — Z7901 Long term (current) use of anticoagulants: Secondary | ICD-10-CM

## 2023-03-19 DIAGNOSIS — I2699 Other pulmonary embolism without acute cor pulmonale: Secondary | ICD-10-CM | POA: Diagnosis present

## 2023-03-19 DIAGNOSIS — I2602 Saddle embolus of pulmonary artery with acute cor pulmonale: Principal | ICD-10-CM | POA: Diagnosis present

## 2023-03-19 DIAGNOSIS — F17210 Nicotine dependence, cigarettes, uncomplicated: Secondary | ICD-10-CM | POA: Diagnosis present

## 2023-03-19 DIAGNOSIS — I2692 Saddle embolus of pulmonary artery without acute cor pulmonale: Principal | ICD-10-CM

## 2023-03-19 DIAGNOSIS — M7121 Synovial cyst of popliteal space [Baker], right knee: Secondary | ICD-10-CM | POA: Diagnosis not present

## 2023-03-19 DIAGNOSIS — Z716 Tobacco abuse counseling: Secondary | ICD-10-CM | POA: Diagnosis not present

## 2023-03-19 DIAGNOSIS — I420 Dilated cardiomyopathy: Secondary | ICD-10-CM | POA: Diagnosis not present

## 2023-03-19 DIAGNOSIS — R0602 Shortness of breath: Secondary | ICD-10-CM | POA: Diagnosis not present

## 2023-03-19 DIAGNOSIS — Z5971 Insufficient health insurance coverage: Secondary | ICD-10-CM | POA: Diagnosis not present

## 2023-03-19 LAB — URINALYSIS, ROUTINE W REFLEX MICROSCOPIC
Bacteria, UA: NONE SEEN
Bilirubin Urine: NEGATIVE
Glucose, UA: NEGATIVE mg/dL
Hgb urine dipstick: NEGATIVE
Ketones, ur: NEGATIVE mg/dL
Leukocytes,Ua: NEGATIVE
Nitrite: NEGATIVE
Protein, ur: 30 mg/dL — AB
Specific Gravity, Urine: 1.029 (ref 1.005–1.030)
pH: 5 (ref 5.0–8.0)

## 2023-03-19 LAB — HEPATIC FUNCTION PANEL
ALT: 17 U/L (ref 0–44)
AST: 16 U/L (ref 15–41)
Albumin: 3.4 g/dL — ABNORMAL LOW (ref 3.5–5.0)
Alkaline Phosphatase: 59 U/L (ref 38–126)
Bilirubin, Direct: 0.2 mg/dL (ref 0.0–0.2)
Indirect Bilirubin: 0.5 mg/dL (ref 0.3–0.9)
Total Bilirubin: 0.7 mg/dL (ref 0.0–1.2)
Total Protein: 8.3 g/dL — ABNORMAL HIGH (ref 6.5–8.1)

## 2023-03-19 LAB — CBC
HCT: 40.7 % (ref 39.0–52.0)
Hemoglobin: 13 g/dL (ref 13.0–17.0)
MCH: 27.3 pg (ref 26.0–34.0)
MCHC: 31.9 g/dL (ref 30.0–36.0)
MCV: 85.3 fL (ref 80.0–100.0)
Platelets: 221 10*3/uL (ref 150–400)
RBC: 4.77 MIL/uL (ref 4.22–5.81)
RDW: 13.6 % (ref 11.5–15.5)
WBC: 12 10*3/uL — ABNORMAL HIGH (ref 4.0–10.5)
nRBC: 0 % (ref 0.0–0.2)

## 2023-03-19 LAB — BASIC METABOLIC PANEL
Anion gap: 12 (ref 5–15)
BUN: 17 mg/dL (ref 6–20)
CO2: 20 mmol/L — ABNORMAL LOW (ref 22–32)
Calcium: 9.1 mg/dL (ref 8.9–10.3)
Chloride: 105 mmol/L (ref 98–111)
Creatinine, Ser: 1.12 mg/dL (ref 0.61–1.24)
GFR, Estimated: >60 mL/min (ref >60)
Glucose, Bld: 101 mg/dL — ABNORMAL HIGH (ref 70–99)
Potassium: 3.6 mmol/L (ref 3.5–5.1)
Sodium: 137 mmol/L (ref 135–145)

## 2023-03-19 LAB — TROPONIN I (HIGH SENSITIVITY)
Troponin I (High Sensitivity): 19 ng/L — ABNORMAL HIGH (ref <18)
Troponin I (High Sensitivity): 15 ng/L (ref <18)

## 2023-03-19 MED ORDER — ACETAMINOPHEN 650 MG RE SUPP: 650.0000 mg | Freq: Four times a day (QID) | RECTAL | Status: DC | PRN

## 2023-03-19 MED ORDER — HEPARIN BOLUS VIA INFUSION
7500.0000 [IU] | Freq: Once | INTRAVENOUS | Status: AC
  Administered 2023-03-20: 7500 [IU] via INTRAVENOUS
  Filled 2023-03-19: qty 7500

## 2023-03-19 MED ORDER — IOHEXOL 350 MG/ML SOLN
100.0000 mL | Freq: Once | INTRAVENOUS | Status: AC | PRN
  Administered 2023-03-19: 100 mL via INTRAVENOUS

## 2023-03-19 MED ORDER — ONDANSETRON HCL 4 MG PO TABS: 4.0000 mg | ORAL_TABLET | Freq: Four times a day (QID) | ORAL | Status: DC | PRN

## 2023-03-19 MED ORDER — ACETAMINOPHEN 325 MG PO TABS: 650.0000 mg | ORAL_TABLET | Freq: Four times a day (QID) | ORAL | Status: DC | PRN

## 2023-03-19 MED ORDER — HYDROCODONE-ACETAMINOPHEN 5-325 MG PO TABS
1.0000 | ORAL_TABLET | ORAL | Status: DC | PRN
  Administered 2023-03-21: 2 via ORAL
  Filled 2023-03-19: qty 2

## 2023-03-19 MED ORDER — ONDANSETRON HCL 4 MG/2ML IJ SOLN: 4.0000 mg | Freq: Four times a day (QID) | INTRAMUSCULAR | Status: DC | PRN

## 2023-03-19 MED ORDER — NICOTINE 14 MG/24HR TD PT24
14.0000 mg | MEDICATED_PATCH | Freq: Every day | TRANSDERMAL | Status: DC
Start: 2023-03-20 — End: 2023-03-22
  Filled 2023-03-19 (×2): qty 1

## 2023-03-19 MED ORDER — HEPARIN (PORCINE) 25000 UT/250ML-% IV SOLN
2350.0000 [IU]/h | INTRAVENOUS | Status: DC
  Administered 2023-03-20 (×2): 2000 [IU]/h via INTRAVENOUS
  Administered 2023-03-20: 2100 [IU]/h via INTRAVENOUS
  Administered 2023-03-21: 2350 [IU]/h via INTRAVENOUS
  Filled 2023-03-19 (×4): qty 250

## 2023-03-19 NOTE — Assessment & Plan Note (Addendum)
 Complicating factor to overall prognosis For counseling on lifestyle modification

## 2023-03-19 NOTE — Progress Notes (Signed)
 ANTICOAGULATION CONSULT NOTE  Pharmacy Consult for heparin infusion Indication: pulmonary embolus  No Known Allergies  Patient Measurements: Height: 6\' 1"  (185.4 cm) Weight: (!) 179.4 kg (395 lb 6.4 oz) IBW/kg (Calculated) : 79.9 Heparin Dosing Weight: 124.3 kg  Vital Signs: Temp: 98 F (36.7 C) (03/18 2200) Temp Source: Oral (03/18 2007) BP: 116/77 (03/18 2200) Pulse Rate: 89 (03/18 2200)  Labs: Recent Labs    03/19/23 1610 03/19/23 2208  HGB 13.0  --   HCT 40.7  --   PLT 221  --   CREATININE 1.12  --   TROPONINIHS 19* 15    Estimated Creatinine Clearance: 155.9 mL/min (by C-G formula based on SCr of 1.12 mg/dL).   Medical History: History reviewed. No pertinent past medical history.  Assessment: Pt is a 38 yo male presenting to ED for "SOB and right leg swelling/burning since Saturday," found with "large pulmonary arterial embolic burden with saddle embolus and near occlusive emboli in the right greater than left lower lobe main arteries, with extension into the segmental and subsegmental arteries in both upper and lower lobes, main and segmental arteries in the right middle lobe."  Goal of Therapy:  Heparin level 0.3-0.7 units/ml Monitor platelets by anticoagulation protocol: Yes   Plan:  Bolus 7500 units x 1 Start heparin infusion at 2000 units/hr Will check HL in 6 hr after start of infusion CBC daily while on heparin  Otelia Sergeant, PharmD, [facility] 03/19/2023 10:54 PM

## 2023-03-19 NOTE — Assessment & Plan Note (Signed)
 CTA PE protocol saddle embolus with large pulmonary arterial embolic burden, RV strain with RV LV ratio 1.15 Risk factors include nicotine dependence and obesity Will need hypercoagulability workup-ordered Spoke directly with vascular surgeon Dr. Wyn Quaker  Will keep n.p.o. from midnight Close hemodynamic monitoring in stepdown given risk of clinical deterioration Echocardiogram

## 2023-03-19 NOTE — ED Triage Notes (Signed)
 Patient to ED via POV for SOB and right leg swelling/burning since Saturday. Ambulatory to triage but dyspnea with exertion noted.

## 2023-03-19 NOTE — ED Notes (Signed)
 2 unsuccessful attempts for IV placement. Float rn attempting at this time.

## 2023-03-19 NOTE — ED Notes (Signed)
 Pt to radiology at this time.

## 2023-03-19 NOTE — ED Notes (Signed)
 Pt returned from CT; green top collected and sent to lab at this time. Pt provided w/ warm blanket per request; lights dimmed for comfort, call light within reach. Pt denies any other needs, awaiting results.

## 2023-03-19 NOTE — ED Notes (Signed)
CT aware pt has IV

## 2023-03-19 NOTE — Assessment & Plan Note (Signed)
 Nicotine patch  for counseling on cessation

## 2023-03-19 NOTE — H&P (Signed)
 History and Physical    Patient: Peter Sparks GLO:[id number] DOB: 1985-07-18 DOA: 03/19/2023 DOS: the patient was seen and examined on 03/19/2023 PCP: Patient, No Pcp Per  Patient coming from: Home  Chief Complaint:  Chief Complaint  Patient presents with   Shortness of Breath    HPI: Peter Sparks is a 38 y.o. male with medical history significant for Tobacco use disorder, morbid obesity and history of depression who presents to the ED with a 2-week history of shortness of breath.  Denies cough, fever, chills or chest pain.  Endorses right leg pain. ED course and dataReview: Tachycardic on arrival with otherwise normal vitals Labs: Troponin 19--15 CBC notable for leukocytosis of 12,000.  BMP, hepatic function panel, UA otherwise unremarkable EKG, personally viewed and interpreted showing sinus tachycardia at a 103 CTA PE protocol saddle embolus with large pulmonary arterial embolic burden, RV strain with RV LV ratio 1.15 Right lower extremity venous ultrasound negative for DVT but showing right Baker's cyst Heparin infusion ordered Hospitalist consulted for admission.     History reviewed. No pertinent past medical history. History reviewed. No pertinent surgical history. Social History:  reports that he has been smoking. He has never used smokeless tobacco. He reports current alcohol use. He reports current drug use. Drug: Marijuana.  No Known Allergies  History reviewed. No pertinent family history.  Prior to Admission medications   Medication Sig Start Date End Date Taking? Authorizing Provider  ketorolac (TORADOL) 10 MG tablet Take 1 tablet (10 mg total) by mouth every 8 (eight) hours. 07/22/19   Menshew, Charlesetta Ivory, PA-C  ondansetron (ZOFRAN ODT) 4 MG disintegrating tablet Take 1 tablet (4 mg total) by mouth every 8 (eight) hours as needed. 07/22/19   Menshew, Charlesetta Ivory, PA-C    Physical Exam: Vitals:   03/19/23 1610 03/19/23 2007 03/19/23 2200 03/19/23  2244  BP: (!) 154/106 128/80 116/77   Pulse:   89   Resp:  18 18   Temp:  98.6 F (37 C) 98 F (36.7 C)   TempSrc:  Oral    SpO2:  100% 100%   Weight:    (!) 179.4 kg  Height:       Physical Exam  Labs on Admission: I have personally reviewed following labs and imaging studies  CBC: Recent Labs  Lab 03/19/23 1610  WBC 12.0*  HGB 13.0  HCT 40.7  MCV 85.3  PLT 221   Basic Metabolic Panel: Recent Labs  Lab 03/19/23 1610  NA 137  K 3.6  CL 105  CO2 20*  GLUCOSE 101*  BUN 17  CREATININE 1.12  CALCIUM 9.1   GFR: Estimated Creatinine Clearance: 155.9 mL/min (by C-G formula based on SCr of 1.12 mg/dL). Liver Function Tests: Recent Labs  Lab 03/19/23 1610  AST 16  ALT 17  ALKPHOS 59  BILITOT 0.7  PROT 8.3*  ALBUMIN 3.4*   No results for input(s): "LIPASE", "AMYLASE" in the last 168 hours. No results for input(s): "AMMONIA" in the last 168 hours. Coagulation Profile: No results for input(s): "INR", "PROTIME" in the last 168 hours. Cardiac Enzymes: No results for input(s): "CKTOTAL", "CKMB", "CKMBINDEX", "TROPONINI" in the last 168 hours. BNP (last 3 results) No results for input(s): "PROBNP" in the last 8760 hours. HbA1C: No results for input(s): "HGBA1C" in the last 72 hours. CBG: No results for input(s): "GLUCAP" in the last 168 hours. Lipid Profile: No results for input(s): "CHOL", "HDL", "LDLCALC", "TRIG", "CHOLHDL", "LDLDIRECT" in the last  72 hours. Thyroid Function Tests: No results for input(s): "TSH", "T4TOTAL", "FREET4", "T3FREE", "THYROIDAB" in the last 72 hours. Anemia Panel: No results for input(s): "VITAMINB12", "FOLATE", "FERRITIN", "TIBC", "IRON", "RETICCTPCT" in the last 72 hours. Urine analysis:    Component Value Date/Time   COLORURINE YELLOW (A) 03/19/2023 2102   APPEARANCEUR HAZY (A) 03/19/2023 2102   LABSPEC 1.029 03/19/2023 2102   PHURINE 5.0 03/19/2023 2102   GLUCOSEU NEGATIVE 03/19/2023 2102   HGBUR NEGATIVE 03/19/2023 2102    BILIRUBINUR NEGATIVE 03/19/2023 2102   KETONESUR NEGATIVE 03/19/2023 2102   PROTEINUR 30 (A) 03/19/2023 2102   NITRITE NEGATIVE 03/19/2023 2102   LEUKOCYTESUR NEGATIVE 03/19/2023 2102    Radiological Exams on Admission: CT Angio Chest PE W/Cm &/Or Wo Cm Result Date: 03/19/2023 CLINICAL DATA:  Shortness of breath with right lower extremity swelling and burning. Suspected PE, high probability. EXAM: CT ANGIOGRAPHY CHEST WITH CONTRAST TECHNIQUE: Multidetector CT imaging of the chest was performed using the standard protocol during bolus administration of intravenous contrast. Multiplanar CT image reconstructions and MIPs were obtained to evaluate the vascular anatomy. RADIATION DOSE REDUCTION: This exam was performed according to the departmental dose-optimization program which includes automated exposure control, adjustment of the mA and/or kV according to patient size and/or use of iterative reconstruction technique. CONTRAST:  OMNIPAQUE IOHEXOL 350 MG/ML SOLN COMPARISON:  PA and lateral chest today, chest radiograph 07/10/2014. FINDINGS: Cardiovascular: The pulmonary trunk is prominent indicating arterial hypertension measuring 3.5 cm. There is a large pulmonary arterial embolic burden. There is an acute saddle embolus partially occluding both distal main bronchi, with near occlusive emboli in the right greater than left lower lobe main arteries with extension into the segmental lower lobe arteries and scattered down stream subsegmental arteries. There are segmental and subsegmental emboli in both upper and lower lobes as well, with near occlusive thrombus in the interlobar pulmonary artery and the medial and lateral segmental arteries to the right middle lobe. There is an elevated RV/LV ratio 1.15 indicating right heart strain. The heart is slightly enlarged. There is no pericardial effusion. The aorta and great vessels are normal with normal variant short-segment brachiobicarotid trunk and 2  vessel aortic arch. The pulmonary veins are not distended. Mediastinum/Nodes: No enlarged mediastinal, hilar, or axillary lymph nodes. The lower poles of the thyroid gland, trachea, and esophagus demonstrate no significant findings. Lungs/Pleura: Lungs are clear. No pleural effusion or pneumothorax. Upper Abdomen: No acute findings. Musculoskeletal: No chest wall abnormality to the extent visualized. The entire chest wall could not be included in the field. No acute or significant osseous findings. Review of the MIP images confirms the above findings. IMPRESSION: 1. Large pulmonary arterial embolic burden with saddle embolus and near occlusive emboli in the right greater than left lower lobe main arteries, with extension into the segmental and subsegmental arteries in both upper and lower lobes, main and segmental arteries in the right middle lobe. 2. Positive for acute PE with CT evidence of right heart strain (RV/LV Ratio = 1.15) consistent with at least submassive (intermediate risk) PE. The presence of right heart strain has been associated with an increased risk of morbidity and mortality. Please refer to the "Code PE Focused" order set in EPIC. 3. Prominent pulmonary trunk. 4. Mild cardiomegaly. 5. No other acute chest CT findings. 6. Critical Value/emergent results were called by telephone at the time of interpretation on 03/19/2023 at 11:10 pm to provider [facility] , who verbally acknowledged these results. Electronically Signed   By: Mellody Dance  Chesser M.D.   On: 03/19/2023 23:20   US Venous Img Lower Unilateral Right Result Date: 03/19/2023 CLINICAL DATA:  Shortness of breath and right leg swelling. EXAM: RIGHT LOWER EXTREMITY VENOUS DOPPLER ULTRASOUND TECHNIQUE: Gray-scale sonography with graded compression, as well as color Doppler and duplex ultrasound were performed to evaluate the lower extremity deep venous systems from the level of the common femoral vein and including the common femoral,  femoral, profunda femoral, popliteal and calf veins including the posterior tibial, peroneal and gastrocnemius veins when visible. The superficial great saphenous vein was also interrogated. Spectral Doppler was utilized to evaluate flow at rest and with distal augmentation maneuvers in the common femoral, femoral and popliteal veins. COMPARISON:  None Available. FINDINGS: Contralateral Common Femoral Vein: Respiratory phasicity is normal and symmetric with the symptomatic side. No evidence of thrombus. Normal compressibility. Common Femoral Vein: No evidence of thrombus. Normal compressibility, respiratory phasicity and response to augmentation. Saphenofemoral Junction: No evidence of thrombus. Normal compressibility and flow on color Doppler imaging. Profunda Femoral Vein: No evidence of thrombus. Normal compressibility and flow on color Doppler imaging. Femoral Vein: No evidence of thrombus. Normal compressibility, respiratory phasicity and response to augmentation. Popliteal Vein: No evidence of thrombus. Normal compressibility, respiratory phasicity and response to augmentation. Calf Veins: The RIGHT peroneal vein is limited in visualization secondary to surrounding edema. No evidence of thrombus within the RIGHT posterior tibial vein. Normal compressibility and flow on color Doppler imaging. Superficial Great Saphenous Vein: No evidence of thrombus. Normal compressibility. Venous Reflux:  None. Other Findings: A 1.4 cm x 0.6 cm x 2.9 cm cyst is seen within the soft tissue structures of the RIGHT popliteal fossa. Right groin lymph nodes are seen. The largest measures approximately 4.1 cm x 1.4 cm x 3.0 cm. IMPRESSION: 1. Limited evaluation of the RIGHT peroneal vein without evidence of deep venous thrombosis within the RIGHT lower extremity. 2. RIGHT Baker's cyst. Electronically Signed   By: Aram Candela M.D.   On: 03/19/2023 19:20   DG Chest 2 View Result Date: 03/19/2023 CLINICAL DATA:  COPD.   Shortness of breath. EXAM: CHEST - 2 VIEW COMPARISON:  Chest radiograph dated 07/10/2014. FINDINGS: The heart size and mediastinal contours are within normal limits. Both lungs are clear. The visualized skeletal structures are unremarkable. IMPRESSION: No active cardiopulmonary disease. Electronically Signed   By: Elgie Collard M.D.   On: 03/19/2023 17:51     Data Reviewed: Relevant notes from primary care and specialist visits, past discharge summaries as available in EHR, including Care Everywhere. Prior diagnostic testing as pertinent to current admission diagnoses Updated medications and problem lists for reconciliation ED course, including vitals, labs, imaging, treatment and response to treatment Triage notes, nursing and pharmacy notes and ED provider's notes Notable results as noted in HPI   Assessment and Plan: * Acute saddle pulmonary embolism with acute cor pulmonale (HCC) CTA PE protocol saddle embolus with large pulmonary arterial embolic burden, RV strain with RV LV ratio 1.15 Risk factors include nicotine dependence and obesity Will need hypercoagulability workup-ordered Spoke directly with vascular surgeon Dr. Wyn Quaker  Will keep n.p.o. from midnight Close hemodynamic monitoring in stepdown given risk of clinical deterioration Echocardiogram  Tobacco use disorder Nicotine patch  for counseling on cessation  Morbid obesity with BMI of 50.0-59.9, adult (HCC) Complicating factor to overall prognosis For counseling on lifestyle modification        DVT prophylaxis: On heparin infusion  Consults: Vascular surgeon, Dr. Wyn Quaker Advance Care Planning:  Code Status: Prior   Family Communication: none  Disposition Plan: Back to previous home environment  Severity of Illness: The appropriate patient status for this patient is INPATIENT. Inpatient status is judged to be reasonable and necessary in order to provide the required intensity of service to ensure the patient's  safety. The patient's presenting symptoms, physical exam findings, and initial radiographic and laboratory data in the context of their chronic comorbidities is felt to place them at high risk for further clinical deterioration. Furthermore, it is not anticipated that the patient will be medically stable for discharge from the hospital within 2 midnights of admission.   * I certify that at the point of admission it is my clinical judgment that the patient will require inpatient hospital care spanning beyond 2 midnights from the point of admission due to high intensity of service, high risk for further deterioration and high frequency of surveillance required.*  Author: Andris Baumann, MD 03/19/2023 11:42 PM  For on call review [email or web address].

## 2023-03-19 NOTE — ED Provider Notes (Signed)
 [facility] Provider Note    Event Date/Time   First MD Initiated Contact with Patient 03/19/23 2001     (approximate)   History   Chief Complaint Shortness of Breath   HPI  Peter Sparks is a 38 y.o. male with no significant past medical history who presents to the ED patient reports that he has been dealing with weeks of dyspnea on exertion, also reports feeling short of breath depending on the position he lays in at night.  He has had some sharp pain in his upper back with this, reports 1 day of chest pain a few days ago, but this has since resolved.  He denies any fevers or cough, has had some swelling and discomfort in his right lower extremity.  He denies any trauma to his leg, denies any history of DVT/PE.     Physical Exam   Triage Vital Signs: ED Triage Vitals  Encounter Vitals Group     BP 03/19/23 1610 (!) 154/106     Systolic BP Percentile --      Diastolic BP Percentile --      Pulse Rate 03/19/23 1607 (!) 115     Resp 03/19/23 1607 17     Temp 03/19/23 1607 98.2 F (36.8 C)     Temp Source 03/19/23 1607 Oral     SpO2 03/19/23 1607 95 %     Weight 03/19/23 1607 (!) 400 lb (181.4 kg)     Height 03/19/23 1607 6\' 1"  (1.854 m)     Head Circumference --      Peak Flow --      Pain Score 03/19/23 1607 8     Pain Loc --      Pain Education --      Exclude from Growth Chart --     Most recent vital signs: Vitals:   03/19/23 2007 03/19/23 2200  BP: 128/80 116/77  Pulse:  89  Resp: 18 18  Temp: 98.6 F (37 C) 98 F (36.7 C)  SpO2: 100% 100%    Constitutional: Alert and oriented. Eyes: Conjunctivae are normal. Head: Atraumatic. Nose: No congestion/rhinnorhea. Mouth/Throat: Mucous membranes are moist.  Cardiovascular: Normal rate, regular rhythm. Grossly normal heart sounds.  2+ radial and DP pulses bilaterally. Respiratory: Normal respiratory effort.  No retractions. Lungs CTAB. Gastrointestinal: Soft and nontender. No  distention. Musculoskeletal: 1+ pitting edema with mild erythema noted to right lower extremity, no associated tenderness noted.  No erythema or edema noted to left lower extremity. Neurologic:  Normal speech and language. No gross focal neurologic deficits are appreciated.    ED Results / Procedures / Treatments   Labs (all labs ordered are listed, but only abnormal results are displayed) Labs Reviewed  BASIC METABOLIC PANEL - Abnormal; Notable for the following components:      Result Value   CO2 20 (*)    Glucose, Bld 101 (*)    All other components within normal limits  CBC - Abnormal; Notable for the following components:   WBC 12.0 (*)    All other components within normal limits  URINALYSIS, ROUTINE W REFLEX MICROSCOPIC - Abnormal; Notable for the following components:   Color, Urine YELLOW (*)    APPearance HAZY (*)    Protein, ur 30 (*)    All other components within normal limits  HEPATIC FUNCTION PANEL - Abnormal; Notable for the following components:   Total Protein 8.3 (*)    Albumin 3.4 (*)  All other components within normal limits  TROPONIN I (HIGH SENSITIVITY) - Abnormal; Notable for the following components:   Troponin I (High Sensitivity) 19 (*)    All other components within normal limits  APTT  PROTIME-INR  TROPONIN I (HIGH SENSITIVITY)     EKG  ED ECG REPORT I, Chesley Noon, the attending physician, personally viewed and interpreted this ECG.   Date: 03/19/2023  EKG Time: 16:14  Rate: 103  Rhythm: sinus tachycardia  Axis: Normal  Intervals:none  ST&T Change: None  RADIOLOGY Chest x-ray reviewed and interpreted by me with no infiltrate, edema, or effusion.  PROCEDURES:  Critical Care performed: Yes, see critical care procedure note(s)  .Critical Care  Performed by: Chesley Noon, MD Authorized by: Chesley Noon, MD   Critical care provider statement:    Critical care time (minutes):  30   Critical care time was exclusive of:   Separately billable procedures and treating other patients and teaching time   Critical care was necessary to treat or prevent imminent or life-threatening deterioration of the following conditions:  Cardiac failure (Pulmonary embolism)   Critical care was time spent personally by me on the following activities:  Development of treatment plan with patient or surrogate, discussions with consultants, evaluation of patient's response to treatment, examination of patient, ordering and review of laboratory studies, ordering and review of radiographic studies, ordering and performing treatments and interventions, pulse oximetry, re-evaluation of patient's condition and review of old charts    MEDICATIONS ORDERED IN ED: Medications  heparin bolus via infusion 7,500 Units (has no administration in time range)  heparin ADULT infusion 100 units/mL (25000 units/276mL) (has no administration in time range)  iohexol (OMNIPAQUE) 350 MG/ML injection 100 mL (100 mLs Intravenous Contrast Given 03/19/23 2201)     IMPRESSION / MDM / ASSESSMENT AND PLAN / ED COURSE  I reviewed the triage vital signs and the nursing notes.                              38 y.o. male with no significant past medical history presents to the ED with 2 weeks of increasing dyspnea on exertion as well as sharp upper back pain and swelling in his right leg.  Patient's presentation is most consistent with acute presentation with potential threat to life or bodily function.  Differential diagnosis includes, but is not limited to, ACS, PE, pneumonia, CHF, COPD, viral syndrome, anemia, electrolyte abnormality, AKI.  Patient nontoxic-appearing and in no acute distress, vital signs remarkable for tachycardia but otherwise reassuring.  He is not in any respiratory distress, maintaining oxygen saturations at 100% on room air and lungs clear to auscultation bilaterally.  EKG shows no evidence arrhythmia or ischemia, troponin pending at this time,  chest x-ray negative for acute process.  Labs without significant anemia, leukocytosis, electrolyte abnormality, or AKI.  He is neurovascular intact to his right lower extremity, ultrasound shows no evidence of DVT and I suspect venous insufficiency.  We will further assess with CTA of his chest to rule out PE.  CTA chest concerning for saddle pulmonary embolism by my read, will start on IV heparin.  Repeat troponin is stable and patient remains hemodynamically stable, maintaining oxygen saturations at 100% on room air.  Case discussed with hospitalist for admission.      FINAL CLINICAL IMPRESSION(S) / ED DIAGNOSES   Final diagnoses:  Acute saddle pulmonary embolism, unspecified whether acute cor pulmonale present (HCC)  Rx / DC Orders   ED Discharge Orders     None        Note:  This document was prepared using Dragon voice recognition software and may include unintentional dictation errors.   Chesley Noon, MD 03/19/23 [phone]

## 2023-03-19 NOTE — ED Notes (Signed)
 IV team at bedside

## 2023-03-20 ENCOUNTER — Encounter
Admission: EM | Disposition: A | Discharge status: Home / Self Care | Payer: Self-pay | Source: Home / Self Care | Attending: Internal Medicine

## 2023-03-20 ENCOUNTER — Inpatient Hospital Stay (HOSPITAL_COMMUNITY)
Admit: 2023-03-20 | Discharge: 2023-03-20 | Disposition: A | Discharge status: Home / Self Care | Payer: Self-pay | Attending: Internal Medicine | Admitting: Internal Medicine

## 2023-03-20 DIAGNOSIS — I2699 Other pulmonary embolism without acute cor pulmonale: Secondary | ICD-10-CM | POA: Diagnosis present

## 2023-03-20 DIAGNOSIS — I2609 Other pulmonary embolism with acute cor pulmonale: Secondary | ICD-10-CM

## 2023-03-20 DIAGNOSIS — Z6841 Body Mass Index (BMI) 40.0 and over, adult: Secondary | ICD-10-CM

## 2023-03-20 DIAGNOSIS — F17200 Nicotine dependence, unspecified, uncomplicated: Secondary | ICD-10-CM

## 2023-03-20 DIAGNOSIS — M7121 Synovial cyst of popliteal space [Baker], right knee: Secondary | ICD-10-CM | POA: Insufficient documentation

## 2023-03-20 HISTORY — PX: PULMONARY THROMBECTOMY: CATH118295

## 2023-03-20 LAB — ECHOCARDIOGRAM COMPLETE
AR max vel: 2.39 cm2
AV Area VTI: 2.41 cm2
AV Area mean vel: 2.3 cm2
AV Mean grad: 2 mmHg
AV Peak grad: 4 mmHg
Ao pk vel: 1 m/s
Area-P 1/2: 5.46 cm2
Height: 73 in
S' Lateral: 4.4 cm
Single Plane A4C EF: 16.1 %
Weight: 6326.4 [oz_av]

## 2023-03-20 LAB — HIV ANTIBODY (ROUTINE TESTING W REFLEX): HIV Screen 4th Generation wRfx: NONREACTIVE

## 2023-03-20 LAB — HEPARIN LEVEL (UNFRACTIONATED)
Heparin Unfractionated: 0.38 [IU]/mL (ref 0.30–0.70)
Heparin Unfractionated: 0.3 [IU]/mL (ref 0.30–0.70)

## 2023-03-20 LAB — PROTIME-INR
INR: 1.1 (ref 0.8–1.2)
Prothrombin Time: 14.3 s (ref 11.4–15.2)

## 2023-03-20 LAB — ANTITHROMBIN III: AntiThromb III Func: 120 % (ref 75–120)

## 2023-03-20 LAB — APTT: aPTT: 30 s (ref 24–36)

## 2023-03-20 SURGERY — PULMONARY THROMBECTOMY
Anesthesia: Moderate Sedation | Laterality: Bilateral

## 2023-03-20 MED ORDER — HEPARIN SODIUM (PORCINE) 1000 UNIT/ML IJ SOLN
INTRAMUSCULAR | Status: DC | PRN
  Administered 2023-03-20: 4000 [IU] via INTRAVENOUS

## 2023-03-20 MED ORDER — IODIXANOL 320 MG/ML IV SOLN
INTRAVENOUS | Status: DC | PRN
  Administered 2023-03-20: 70 mL via INTRAVENOUS

## 2023-03-20 MED ORDER — DIPHENHYDRAMINE HCL 50 MG/ML IJ SOLN: 50.0000 mg | Freq: Once | INTRAMUSCULAR | Status: DC | PRN

## 2023-03-20 MED ORDER — CEFAZOLIN SODIUM-DEXTROSE 2-4 GM/100ML-% IV SOLN
2.0000 g | INTRAVENOUS | Status: AC
  Administered 2023-03-20: 2 g via INTRAVENOUS
  Filled 2023-03-20: qty 100

## 2023-03-20 MED ORDER — HYDRALAZINE HCL 20 MG/ML IJ SOLN
INTRAMUSCULAR | Status: AC
  Filled 2023-03-20: qty 1

## 2023-03-20 MED ORDER — CEFAZOLIN SODIUM-DEXTROSE 2-4 GM/100ML-% IV SOLN
INTRAVENOUS | Status: AC
  Filled 2023-03-20: qty 100

## 2023-03-20 MED ORDER — MIDAZOLAM HCL 2 MG/2ML IJ SOLN
INTRAMUSCULAR | Status: DC | PRN
Start: 2023-03-20 — End: 2023-03-20
  Administered 2023-03-20: 2 mg via INTRAVENOUS

## 2023-03-20 MED ORDER — HEPARIN (PORCINE) IN NACL 2000-0.9 UNIT/L-% IV SOLN
INTRAVENOUS | Status: DC | PRN
  Administered 2023-03-20: 1000 mL

## 2023-03-20 MED ORDER — FAMOTIDINE 20 MG PO TABS: 40.0000 mg | ORAL_TABLET | Freq: Once | ORAL | Status: DC | PRN

## 2023-03-20 MED ORDER — FENTANYL CITRATE PF 50 MCG/ML IJ SOSY: 12.5000 ug | PREFILLED_SYRINGE | Freq: Once | INTRAMUSCULAR | Status: DC | PRN

## 2023-03-20 MED ORDER — MIDAZOLAM HCL 2 MG/ML PO SYRP: 8.0000 mg | ORAL_SOLUTION | Freq: Once | ORAL | Status: DC | PRN

## 2023-03-20 MED ORDER — METHYLPREDNISOLONE SODIUM SUCC 125 MG IJ SOLR: 125.0000 mg | Freq: Once | INTRAMUSCULAR | Status: DC | PRN

## 2023-03-20 MED ORDER — SODIUM CHLORIDE 0.9 % IV SOLN: INTRAVENOUS | Status: DC

## 2023-03-20 MED ORDER — FENTANYL CITRATE (PF) 100 MCG/2ML IJ SOLN
INTRAMUSCULAR | Status: AC
Start: 2023-03-20 — End: ?
  Filled 2023-03-20: qty 2

## 2023-03-20 MED ORDER — FENTANYL CITRATE (PF) 100 MCG/2ML IJ SOLN
INTRAMUSCULAR | Status: DC | PRN
  Administered 2023-03-20: 50 ug via INTRAVENOUS

## 2023-03-20 MED ORDER — HYDRALAZINE HCL 20 MG/ML IJ SOLN
INTRAMUSCULAR | Status: DC | PRN
  Administered 2023-03-20: 10 mg via INTRAVENOUS

## 2023-03-20 MED ORDER — LIDOCAINE-EPINEPHRINE (PF) 1 %-1:200000 IJ SOLN
INTRAMUSCULAR | Status: DC | PRN
  Administered 2023-03-20: 10 mL

## 2023-03-20 MED ORDER — MIDAZOLAM HCL 5 MG/5ML IJ SOLN
INTRAMUSCULAR | Status: AC
  Filled 2023-03-20: qty 5

## 2023-03-20 MED ORDER — HEPARIN SODIUM (PORCINE) 1000 UNIT/ML IJ SOLN
INTRAMUSCULAR | Status: AC
  Filled 2023-03-20: qty 10

## 2023-03-20 SURGICAL SUPPLY — 16 items
CANISTER PENUMBRA ENGINE (MISCELLANEOUS) IMPLANT
CATH ANGIO 5F PIGTAIL 100CM (CATHETERS) IMPLANT
CATH INDIGO 12XTORQ 100 (CATHETERS) IMPLANT
CATH INDIGO SEP 12 (CATHETERS) IMPLANT
CATH SELECT BERN TIP 5F 130 (CATHETERS) IMPLANT
CLOSURE PERCLOSE PROSTYLE (VASCULAR PRODUCTS) IMPLANT
COVER PROBE ULTRASOUND 5X96 (MISCELLANEOUS) IMPLANT
GLIDEWIRE ADV .035X180CM (WIRE) IMPLANT
INTRODUCER PERFORM 12FR X13 (SHEATH) IMPLANT
PACK ANGIOGRAPHY (CUSTOM PROCEDURE TRAY) ×1 IMPLANT
SHEATH BRITE TIP 6FRX11 (SHEATH) IMPLANT
SUT MNCRL AB 4-0 PS2 18 (SUTURE) IMPLANT
SYR MEDRAD MARK 7 150ML (SYRINGE) IMPLANT
TUBING CONTRAST HIGH PRESS 72 (TUBING) IMPLANT
WIRE J 3MM .035X145CM (WIRE) IMPLANT
WIRE SUPRACORE 300CM (WIRE) IMPLANT

## 2023-03-20 NOTE — Op Note (Signed)
 [facility]  Percutaneous Study/Intervention Procedural Note   Date of Surgery: 03/20/2023,5:31 PM  Surgeon: Festus Barren  Pre-operative Diagnosis: Symptomatic bilateral pulmonary emboli  Post-operative diagnosis:  Same  Procedure(s) Performed:  1.  Contrast injection right heart  2.  Mechanical thrombectomy using the penumbra CAT 12 catheter to the left lower lobe pulmonary artery and the right lower lobe, middle lobe, and upper lobe pulmonary arteries  3.  Selective catheter placement left lower lobe and left upper lobe pulmonary artery  4.  Selective catheter placement right lower lobe, middle lobe, and upper lobe pulmonary artery    Anesthesia: Conscious sedation was administered under my direct supervision by the interventional radiology RN. IV Versed plus fentanyl were utilized. Continuous ECG, pulse oximetry and blood pressure was monitored throughout the entire procedure.  Versed and fentanyl were administered intravenously.  Conscious sedation was administered for a total of 32 minutes using 2 mg of Versed and 50 mcg of Fentanyl.  EBL: 350 cc  Sheath: 12 French right femoral vein  Contrast: 70 cc   Fluoroscopy Time: 11.3 minutes  Indications:  Patient presents with pulmonary emboli. The patient is symptomatic with hypoxemia and dyspnea on exertion.  There is evidence of right heart strain on the CT angiogram. The patient is otherwise a good candidate for intervention and even the long-term benefits pulmonary angiography with thrombolysis is offered. The risks and benefits are reviewed long-term benefits are discussed. All questions are answered patient agrees to proceed.  Procedure:  Teofil Maniaci Poteatis a 38 y.o. male who was identified and appropriate procedural time out was performed.  The patient was then placed supine on the table and prepped and draped in the usual sterile fashion.  Ultrasound was used to evaluate the right common femoral vein.  It was  patent, as it was echolucent and compressible.  A digital ultrasound image was acquired for the permanent record.  A Seldinger needle was used to access the right common femoral vein under direct ultrasound guidance.  A 0.035 J wire was advanced without resistance and a 6Fr sheath was placed. A proglide device was placed in a preclose fashion and then upsized to a 12 [place] sheath.    The Advantage wire and pigtail catheter were then negotiated into the right atrium and bolus injection of contrast was utilized to demonstrate the right ventricle and the pulmonary artery outflow. The Advantage wire and catheter were then negotiated into the the pulmonary arteries.  The left pulmonary artery was cannulated and advanced into the left upper lobe and left lower lobe for selective imaging. This demonstrated significant thrombus in the left lower lobe and a smaller amount of thrombus in the left upper lobe pulmonary artery.  I then transitioned to the right side with the advantage wire and catheter and first cannulated the right lower lobe and then the right middle and upper lobes.  This demonstrated significant thrombus burden throughout the right main, lower lobe, middle lobe, and upper lobe pulmonary arteries.  4000 units of heparin was then given and allowed to circulate.   The Penumbra Cat 12 catheter was then advanced up into the pulmonary vasculature. The left lung was addressed first. Catheter was negotiated into the left lower lobe pulmonary artery and mechanical thrombectomy was performed with the help of the separator. Follow-up imaging demonstrated a good result the angle into the left upper lobe pulmonary artery was quite difficult to traverse the CAT 12 catheter, and this did not have nearly significant  thrombus burden as the right side so I turned my attention to the right side.  The Penumbra Cat 12 catheter was then negotiated to the opposite side. The right lung was then addressed.  Initially, the  right main pulmonary artery was treated with pulmonary thrombectomy using the penumbra CAT 12 catheter and then I advanced out into the lobar branches.  Catheter was negotiated into the right lower lobe pulmonary artery and mechanical thrombectomy was performed with the separator. Follow-up imaging demonstrated a good result and therefore the catheter was renegotiated into the right middle lobe pulmonary artery and again mechanical thrombectomy was performed. Passes were made with both the Penumbra catheter itself as well as introducing the separator.  Finally, I was able to advance the penumbra CAT 12 catheter into the right upper lobe pulmonary artery and perform mechanical thrombectomy with the help of the separator.  Follow-up imaging was then performed.  Significant improvement was seen with only a small amount of residual thrombus.  After review these images wires were reintroduced and the catheter is removed. Then, the sheath is then pulled, the proglide device is secured, a Monocryl skin suture was placed and pressure is held. A sterile dressing is placed.    Findings:   Right heart imaging:  Right atrium and right ventricle and the pulmonary outflow tract appears markedly dilated  Right lung: This demonstrated significant thrombus burden throughout the right main, lower lobe, middle lobe, and upper lobe pulmonary arteries  Left lung:  This demonstrated significant thrombus in the left lower lobe and a smaller amount of thrombus in the left upper lobe pulmonary artery.    Disposition: Patient was taken to the recovery room in stable condition having tolerated the procedure well.  Lina Hitch 03/20/2023,5:31 PM

## 2023-03-20 NOTE — ED Notes (Signed)
 Procedural consent signed by the patient and this RN.

## 2023-03-20 NOTE — Interval H&P Note (Signed)
 History and Physical Interval Note:  03/20/2023 3:29 PM  Peter Sparks  has presented today for surgery, with the diagnosis of PAD.  The various methods of treatment have been discussed with the patient and family. After consideration of risks, benefits and other options for treatment, the patient has consented to  Procedure(s): PULMONARY THROMBECTOMY (Bilateral) as a surgical intervention.  The patient's history has been reviewed, patient examined, no change in status, stable for surgery.  I have reviewed the patient's chart and labs.  Questions were answered to the patient's satisfaction.     Festus Barren

## 2023-03-20 NOTE — Progress Notes (Signed)
 ANTICOAGULATION CONSULT NOTE  Pharmacy Consult for heparin infusion Indication: pulmonary embolus  No Known Allergies  Patient Measurements: Height: 6\' 1"  (185.4 cm) Weight: (!) 179.4 kg (395 lb 6.4 oz) IBW/kg (Calculated) : 79.9 Heparin Dosing Weight: 124.3 kg  Vital Signs: Temp: 99.6 F (37.6 C) (03/19 0323) Temp Source: Oral (03/19 0323) BP: 125/94 (03/19 0600) Pulse Rate: 92 (03/19 0600)  Labs: Recent Labs    03/19/23 1610 03/19/23 2208 03/19/23 2347 03/20/23 0640  HGB 13.0  --   --   --   HCT 40.7  --   --   --   PLT 221  --   --   --   APTT  --   --  30  --   LABPROT  --   --  14.3  --   INR  --   --  1.1  --   HEPARINUNFRC  --   --   --  0.38  CREATININE 1.12  --   --   --   TROPONINIHS 19* 15  --   --     Estimated Creatinine Clearance: 155.9 mL/min (by C-G formula based on SCr of 1.12 mg/dL).   Medical History: History reviewed. No pertinent past medical history.  Assessment: Pt is a 38 yo male presenting to ED for "SOB and right leg swelling/burning since Saturday," found with "large pulmonary arterial embolic burden with saddle embolus and near occlusive emboli in the right greater than left lower lobe main arteries, with extension into the segmental and subsegmental arteries in both upper and lower lobes, main and segmental arteries in the right middle lobe."  Goal of Therapy:  Heparin level 0.3-0.7 units/ml Monitor platelets by anticoagulation protocol: Yes   3/19 0640 HL 0.38, therapeutic x 1  Plan:  HL therapeutic x 1 Continue heparin infusion at 2000 units/hr Recheck HL in 6 hours to confirm CBC daily while on heparin  Barrie Folk, PharmD 03/20/2023 7:38 AM

## 2023-03-20 NOTE — Progress Notes (Signed)
*  PRELIMINARY RESULTS* Echocardiogram 2D Echocardiogram has been performed.  Cristela Blue 03/20/2023, 1:45 PM

## 2023-03-20 NOTE — ED Notes (Signed)
 Called CCMD to confirm cardiac monitoring.

## 2023-03-20 NOTE — Progress Notes (Signed)
 ANTICOAGULATION CONSULT NOTE  Pharmacy Consult for heparin infusion Indication: pulmonary embolus  No Known Allergies  Patient Measurements: Height: 6\' 1"  (185.4 cm) Weight: (!) 179.4 kg (395 lb 6.4 oz) IBW/kg (Calculated) : 79.9 Heparin Dosing Weight: 124.3 kg  Vital Signs: Temp: 98.9 F (37.2 C) (03/19 1147) Temp Source: Oral (03/19 1147) BP: 141/93 (03/19 1147) Pulse Rate: 91 (03/19 1147)  Labs: Recent Labs    03/19/23 1610 03/19/23 2208 03/19/23 2347 03/20/23 0640  HGB 13.0  --   --   --   HCT 40.7  --   --   --   PLT 221  --   --   --   APTT  --   --  30  --   LABPROT  --   --  14.3  --   INR  --   --  1.1  --   HEPARINUNFRC  --   --   --  0.38  CREATININE 1.12  --   --   --   TROPONINIHS 19* 15  --   --     Estimated Creatinine Clearance: 155.9 mL/min (by C-G formula based on SCr of 1.12 mg/dL).   Medical History: History reviewed. No pertinent past medical history.  Assessment: Pt is a 38 yo male presenting to ED for "SOB and right leg swelling/burning since Saturday," found with "large pulmonary arterial embolic burden with saddle embolus and near occlusive emboli in the right greater than left lower lobe main arteries, with extension into the segmental and subsegmental arteries in both upper and lower lobes, main and segmental arteries in the right middle lobe."  Goal of Therapy:  Heparin level 0.3-0.7 units/ml Monitor platelets by anticoagulation protocol: Yes   3/19 0640 HL 0.38, therapeutic x 1 3/19 1355 HL 0.3, therapeutic x 2 but borderline subtherapeutic   Plan:  HL therapeutic x 2 but borderline subtherapeutic  Slightly increase heparin infusion to 2100 units/hr to ensure patient does not go subtherapeutic  Recheck HL in 6 hours after rate change  CBC daily while on heparin  Merryl Hacker, PharmD Clinical Pharmacist 03/20/2023 2:16 PM

## 2023-03-20 NOTE — ED Notes (Signed)
Lab called to collect morning blood work

## 2023-03-20 NOTE — Consult Note (Signed)
 Hospital Consult    Reason for Consult:  Pulmonary Embolism  Requesting Physician:  Dr Chesley Noon MD   MRN #:  [id number]  History of Present Illness: This is a 38 y.o. male  with no significant past medical history who presents to the ED patient reports that he has been dealing with weeks of dyspnea on exertion, also reports feeling short of breath depending on the position he lays in at night.  He has had some sharp pain in his upper back with this, reports 1 day of chest pain a few days ago, but this has since resolved.  He denies any fevers or cough, has had some swelling and discomfort in his right lower extremity.  He denies any trauma to his leg, denies any history of DVT/PE.   History reviewed. No pertinent past medical history.  History reviewed. No pertinent surgical history.  No Known Allergies  Prior to Admission medications   Medication Sig Start Date End Date Taking? Authorizing Provider  ketorolac (TORADOL) 10 MG tablet Take 1 tablet (10 mg total) by mouth every 8 (eight) hours. Patient not taking: Reported on 03/20/2023 07/22/19   Menshew, Charlesetta Ivory, PA-C  ondansetron (ZOFRAN ODT) 4 MG disintegrating tablet Take 1 tablet (4 mg total) by mouth every 8 (eight) hours as needed. Patient not taking: Reported on 03/20/2023 07/22/19   Menshew, Charlesetta Ivory, PA-C    Social History   Socioeconomic History   Marital status: Single    Spouse name: Not on file   Number of children: Not on file   Years of education: Not on file   Highest education level: Not on file  Occupational History   Not on file  Tobacco Use   Smoking status: Every Day   Smokeless tobacco: Never  Substance and Sexual Activity   Alcohol use: Yes   Drug use: Yes    Types: Marijuana    Comment: Molly   Sexual activity: Not Currently  Other Topics Concern   Not on file  Social History Narrative   Not on file   Social Drivers of Health   Financial Resource Strain: Not on file  Food  Insecurity: Not on file  Transportation Needs: Not on file  Physical Activity: Not on file  Stress: Not on file  Social Connections: Not on file  Intimate Partner Violence: Not on file     History reviewed. No pertinent family history.  ROS: Otherwise negative unless mentioned in HPI  Physical Examination  Vitals:   03/20/23 0530 03/20/23 0600  BP: (!) 142/93 (!) 125/94  Pulse: 88 92  Resp: 19 (!) 26  Temp:    SpO2: 94% 94%   Body mass index is 52.17 kg/m.  General:  WDWN in NAD Gait: Not observed HENT: WNL, normocephalic Pulmonary: normal non-labored breathing, without Rales, rhonchi,  wheezing Cardiac: regular, without  Murmurs, rubs or gallops; without carotid bruits Abdomen: Positive bowel sounds throughout, soft, NT/ND, no masses Skin: without rashes Vascular Exam/Pulses: Palpable Pulses throughout. Extremities: without ischemic changes, without Gangrene , without cellulitis; without open wounds;  Musculoskeletal: no muscle wasting or atrophy  Neurologic: A&O X 3;  No focal weakness or paresthesias are detected; speech is fluent/normal Psychiatric:  The pt has Normal affect. Lymph:  Unremarkable  CBC    Component Value Date/Time   WBC 12.0 (H) 03/19/2023 1610   RBC 4.77 03/19/2023 1610   HGB 13.0 03/19/2023 1610   HGB 14.3 08/03/2011 1257   HCT 40.7 03/19/2023 1610  HCT 43.1 08/03/2011 1257   PLT 221 03/19/2023 1610   PLT 320 08/03/2011 1257   MCV 85.3 03/19/2023 1610   MCV 87 08/03/2011 1257   MCH 27.3 03/19/2023 1610   MCHC 31.9 03/19/2023 1610   RDW 13.6 03/19/2023 1610   RDW 13.4 08/03/2011 1257   LYMPHSABS 3.4 07/22/2019 1735   MONOABS 0.4 07/22/2019 1735   EOSABS 0.5 07/22/2019 1735   BASOSABS 0.1 07/22/2019 1735    BMET    Component Value Date/Time   NA 137 03/19/2023 1610   NA 139 08/03/2011 1257   K 3.6 03/19/2023 1610   K 3.6 08/03/2011 1257   CL 105 03/19/2023 1610   CL 104 08/03/2011 1257   CO2 20 (L) 03/19/2023 1610   CO2 27  08/03/2011 1257   GLUCOSE 101 (H) 03/19/2023 1610   GLUCOSE 118 (H) 08/03/2011 1257   BUN 17 03/19/2023 1610   BUN 13 08/03/2011 1257   CREATININE 1.12 03/19/2023 1610   CREATININE 1.20 08/03/2011 1257   CALCIUM 9.1 03/19/2023 1610   CALCIUM 9.1 08/03/2011 1257   GFRNONAA >60 03/19/2023 1610   GFRNONAA >60 08/03/2011 1257   GFRAA >60 07/22/2019 1735   GFRAA >60 08/03/2011 1257    COAGS: Lab Results  Component Value Date   INR 1.1 03/19/2023   INR 1.08 07/10/2014     Non-Invasive Vascular Imaging:   EXAM:03/19/23 CT ANGIOGRAPHY CHEST WITH CONTRAST   TECHNIQUE: Multidetector CT imaging of the chest was performed using the standard protocol during bolus administration of intravenous contrast. Multiplanar CT image reconstructions and MIPs were obtained to evaluate the vascular anatomy.   RADIATION DOSE REDUCTION: This exam was performed according to the departmental dose-optimization program which includes automated exposure control, adjustment of the mA and/or kV according to patient size and/or use of iterative reconstruction technique.   CONTRAST:  OMNIPAQUE IOHEXOL 350 MG/ML SOLN   COMPARISON:  PA and lateral chest today, chest radiograph 07/10/2014.   FINDINGS: Cardiovascular: The pulmonary trunk is prominent indicating arterial hypertension measuring 3.5 cm. There is a large pulmonary arterial embolic burden.   There is an acute saddle embolus partially occluding both distal main bronchi, with near occlusive emboli in the right greater than left lower lobe main arteries with extension into the segmental lower lobe arteries and scattered down stream subsegmental arteries.   There are segmental and subsegmental emboli in both upper and lower lobes as well, with near occlusive thrombus in the interlobar pulmonary artery and the medial and lateral segmental arteries to the right middle lobe.   There is an elevated RV/LV ratio 1.15 indicating right heart  strain. The heart is slightly enlarged.   There is no pericardial effusion. The aorta and great vessels are normal with normal variant short-segment brachiobicarotid trunk and 2 vessel aortic arch. The pulmonary veins are not distended.   Mediastinum/Nodes: No enlarged mediastinal, hilar, or axillary lymph nodes. The lower poles of the thyroid gland, trachea, and esophagus demonstrate no significant findings.   Lungs/Pleura: Lungs are clear. No pleural effusion or pneumothorax.   Upper Abdomen: No acute findings.   Musculoskeletal: No chest wall abnormality to the extent visualized. The entire chest wall could not be included in the field. No acute or significant osseous findings.   Review of the MIP images confirms the above findings.   IMPRESSION: 1. Large pulmonary arterial embolic burden with saddle embolus and near occlusive emboli in the right greater than left lower lobe main arteries, with extension into  the segmental and subsegmental arteries in both upper and lower lobes, main and segmental arteries in the right middle lobe. 2. Positive for acute PE with CT evidence of right heart strain (RV/LV Ratio = 1.15) consistent with at least submassive (intermediate risk) PE. The presence of right heart strain has been associated with an increased risk of morbidity and mortality. Please refer to the "Code PE Focused" order set in EPIC. 3. Prominent pulmonary trunk. 4. Mild cardiomegaly. 5. No other acute chest CT findings. 6. Critical Value/emergent results were called by telephone at the time of interpretation on 03/19/2023 at 11:10 pm to provider [facility] , who verbally acknowledged these results.  EXAM:03/19/23 RIGHT LOWER EXTREMITY VENOUS DOPPLER ULTRASOUND   TECHNIQUE: Gray-scale sonography with graded compression, as well as color Doppler and duplex ultrasound were performed to evaluate the lower extremity deep venous systems from the level of the common  femoral vein and including the common femoral, femoral, profunda femoral, popliteal and calf veins including the posterior tibial, peroneal and gastrocnemius veins when visible. The superficial great saphenous vein was also interrogated. Spectral Doppler was utilized to evaluate flow at rest and with distal augmentation maneuvers in the common femoral, femoral and popliteal veins.   COMPARISON:  None Available.   FINDINGS: Contralateral Common Femoral Vein: Respiratory phasicity is normal and symmetric with the symptomatic side. No evidence of thrombus. Normal compressibility.   Common Femoral Vein: No evidence of thrombus. Normal compressibility, respiratory phasicity and response to augmentation.   Saphenofemoral Junction: No evidence of thrombus. Normal compressibility and flow on color Doppler imaging.   Profunda Femoral Vein: No evidence of thrombus. Normal compressibility and flow on color Doppler imaging.   Femoral Vein: No evidence of thrombus. Normal compressibility, respiratory phasicity and response to augmentation.   Popliteal Vein: No evidence of thrombus. Normal compressibility, respiratory phasicity and response to augmentation.   Calf Veins: The RIGHT peroneal vein is limited in visualization secondary to surrounding edema. No evidence of thrombus within the RIGHT posterior tibial vein. Normal compressibility and flow on color Doppler imaging.   Superficial Great Saphenous Vein: No evidence of thrombus. Normal compressibility.   Venous Reflux:  None.   Other Findings: A 1.4 cm x 0.6 cm x 2.9 cm cyst is seen within the soft tissue structures of the RIGHT popliteal fossa.   Right groin lymph nodes are seen. The largest measures approximately 4.1 cm x 1.4 cm x 3.0 cm.   IMPRESSION: 1. Limited evaluation of the RIGHT peroneal vein without evidence of deep venous thrombosis within the RIGHT lower extremity. 2. RIGHT Baker's cyst.   CURRENT  MEDICATIONS Statin:  No. Beta Blocker:  No. Aspirin:  No. ACEI:  No. ARB:  No. CCB use:  No Other antiplatelets/anticoagulants:  No.    ASSESSMENT/PLAN: This is a 38 y.o. male who presents to the ED with a 2-week history of shortness of breath.  He endorses recently has had some right leg pain.  He also endorses that he has a family history of blood clots.  Upon workup patient underwent CTA PE that revealed a large saddle embolus with a large pulmonary arterial emboli attic burden and RV to LV ratio of 1.15.  Patient also underwent right lower extremity venous ultrasound which was negative for DVT but did show a Baker's cyst.  On exam the patient was resting comfortably in the emergency department with a heparin infusion running.  He ambulated to the bathroom and got notably short of breath with oxygen saturation going  down into the 90s.  Heart rate has been consistently tachycardic.  Therefore vascular surgery plans on taking the patient to the vascular lab on 03/20/2023 for a pulmonary thrombectomy.  I discussed in detail with the patient at the bedside this morning the procedure, benefits, risk, and complications.  He verbalizes understanding and wishes to proceed.  I answered all the patient's questions this morning.  Patient will be made n.p.o. for procedure later today.   -I discussed the case in detail with Dr. Festus Barren MD and he agrees with plan.   [facility] 03/20/2023 7:28 AM

## 2023-03-20 NOTE — H&P (View-Only) (Signed)
 Hospital Consult    Reason for Consult:  Pulmonary Embolism  Requesting Physician:  Dr Chesley Noon MD   MRN #:  [id number]  History of Present Illness: This is a 38 y.o. male  with no significant past medical history who presents to the ED patient reports that he has been dealing with weeks of dyspnea on exertion, also reports feeling short of breath depending on the position he lays in at night.  He has had some sharp pain in his upper back with this, reports 1 day of chest pain a few days ago, but this has since resolved.  He denies any fevers or cough, has had some swelling and discomfort in his right lower extremity.  He denies any trauma to his leg, denies any history of DVT/PE.   History reviewed. No pertinent past medical history.  History reviewed. No pertinent surgical history.  No Known Allergies  Prior to Admission medications   Medication Sig Start Date End Date Taking? Authorizing Provider  ketorolac (TORADOL) 10 MG tablet Take 1 tablet (10 mg total) by mouth every 8 (eight) hours. Patient not taking: Reported on 03/20/2023 07/22/19   Menshew, Charlesetta Ivory, PA-C  ondansetron (ZOFRAN ODT) 4 MG disintegrating tablet Take 1 tablet (4 mg total) by mouth every 8 (eight) hours as needed. Patient not taking: Reported on 03/20/2023 07/22/19   Menshew, Charlesetta Ivory, PA-C    Social History   Socioeconomic History   Marital status: Single    Spouse name: Not on file   Number of children: Not on file   Years of education: Not on file   Highest education level: Not on file  Occupational History   Not on file  Tobacco Use   Smoking status: Every Day   Smokeless tobacco: Never  Substance and Sexual Activity   Alcohol use: Yes   Drug use: Yes    Types: Marijuana    Comment: Molly   Sexual activity: Not Currently  Other Topics Concern   Not on file  Social History Narrative   Not on file   Social Drivers of Health   Financial Resource Strain: Not on file  Food  Insecurity: Not on file  Transportation Needs: Not on file  Physical Activity: Not on file  Stress: Not on file  Social Connections: Not on file  Intimate Partner Violence: Not on file     History reviewed. No pertinent family history.  ROS: Otherwise negative unless mentioned in HPI  Physical Examination  Vitals:   03/20/23 0530 03/20/23 0600  BP: (!) 142/93 (!) 125/94  Pulse: 88 92  Resp: 19 (!) 26  Temp:    SpO2: 94% 94%   Body mass index is 52.17 kg/m.  General:  WDWN in NAD Gait: Not observed HENT: WNL, normocephalic Pulmonary: normal non-labored breathing, without Rales, rhonchi,  wheezing Cardiac: regular, without  Murmurs, rubs or gallops; without carotid bruits Abdomen: Positive bowel sounds throughout, soft, NT/ND, no masses Skin: without rashes Vascular Exam/Pulses: Palpable Pulses throughout. Extremities: without ischemic changes, without Gangrene , without cellulitis; without open wounds;  Musculoskeletal: no muscle wasting or atrophy  Neurologic: A&O X 3;  No focal weakness or paresthesias are detected; speech is fluent/normal Psychiatric:  The pt has Normal affect. Lymph:  Unremarkable  CBC    Component Value Date/Time   WBC 12.0 (H) 03/19/2023 1610   RBC 4.77 03/19/2023 1610   HGB 13.0 03/19/2023 1610   HGB 14.3 08/03/2011 1257   HCT 40.7 03/19/2023 1610  HCT 43.1 08/03/2011 1257   PLT 221 03/19/2023 1610   PLT 320 08/03/2011 1257   MCV 85.3 03/19/2023 1610   MCV 87 08/03/2011 1257   MCH 27.3 03/19/2023 1610   MCHC 31.9 03/19/2023 1610   RDW 13.6 03/19/2023 1610   RDW 13.4 08/03/2011 1257   LYMPHSABS 3.4 07/22/2019 1735   MONOABS 0.4 07/22/2019 1735   EOSABS 0.5 07/22/2019 1735   BASOSABS 0.1 07/22/2019 1735    BMET    Component Value Date/Time   NA 137 03/19/2023 1610   NA 139 08/03/2011 1257   K 3.6 03/19/2023 1610   K 3.6 08/03/2011 1257   CL 105 03/19/2023 1610   CL 104 08/03/2011 1257   CO2 20 (L) 03/19/2023 1610   CO2 27  08/03/2011 1257   GLUCOSE 101 (H) 03/19/2023 1610   GLUCOSE 118 (H) 08/03/2011 1257   BUN 17 03/19/2023 1610   BUN 13 08/03/2011 1257   CREATININE 1.12 03/19/2023 1610   CREATININE 1.20 08/03/2011 1257   CALCIUM 9.1 03/19/2023 1610   CALCIUM 9.1 08/03/2011 1257   GFRNONAA >60 03/19/2023 1610   GFRNONAA >60 08/03/2011 1257   GFRAA >60 07/22/2019 1735   GFRAA >60 08/03/2011 1257    COAGS: Lab Results  Component Value Date   INR 1.1 03/19/2023   INR 1.08 07/10/2014     Non-Invasive Vascular Imaging:   EXAM:03/19/23 CT ANGIOGRAPHY CHEST WITH CONTRAST   TECHNIQUE: Multidetector CT imaging of the chest was performed using the standard protocol during bolus administration of intravenous contrast. Multiplanar CT image reconstructions and MIPs were obtained to evaluate the vascular anatomy.   RADIATION DOSE REDUCTION: This exam was performed according to the departmental dose-optimization program which includes automated exposure control, adjustment of the mA and/or kV according to patient size and/or use of iterative reconstruction technique.   CONTRAST:  OMNIPAQUE IOHEXOL 350 MG/ML SOLN   COMPARISON:  PA and lateral chest today, chest radiograph 07/10/2014.   FINDINGS: Cardiovascular: The pulmonary trunk is prominent indicating arterial hypertension measuring 3.5 cm. There is a large pulmonary arterial embolic burden.   There is an acute saddle embolus partially occluding both distal main bronchi, with near occlusive emboli in the right greater than left lower lobe main arteries with extension into the segmental lower lobe arteries and scattered down stream subsegmental arteries.   There are segmental and subsegmental emboli in both upper and lower lobes as well, with near occlusive thrombus in the interlobar pulmonary artery and the medial and lateral segmental arteries to the right middle lobe.   There is an elevated RV/LV ratio 1.15 indicating right heart  strain. The heart is slightly enlarged.   There is no pericardial effusion. The aorta and great vessels are normal with normal variant short-segment brachiobicarotid trunk and 2 vessel aortic arch. The pulmonary veins are not distended.   Mediastinum/Nodes: No enlarged mediastinal, hilar, or axillary lymph nodes. The lower poles of the thyroid gland, trachea, and esophagus demonstrate no significant findings.   Lungs/Pleura: Lungs are clear. No pleural effusion or pneumothorax.   Upper Abdomen: No acute findings.   Musculoskeletal: No chest wall abnormality to the extent visualized. The entire chest wall could not be included in the field. No acute or significant osseous findings.   Review of the MIP images confirms the above findings.   IMPRESSION: 1. Large pulmonary arterial embolic burden with saddle embolus and near occlusive emboli in the right greater than left lower lobe main arteries, with extension into  the segmental and subsegmental arteries in both upper and lower lobes, main and segmental arteries in the right middle lobe. 2. Positive for acute PE with CT evidence of right heart strain (RV/LV Ratio = 1.15) consistent with at least submassive (intermediate risk) PE. The presence of right heart strain has been associated with an increased risk of morbidity and mortality. Please refer to the "Code PE Focused" order set in EPIC. 3. Prominent pulmonary trunk. 4. Mild cardiomegaly. 5. No other acute chest CT findings. 6. Critical Value/emergent results were called by telephone at the time of interpretation on 03/19/2023 at 11:10 pm to provider [facility] , who verbally acknowledged these results.  EXAM:03/19/23 RIGHT LOWER EXTREMITY VENOUS DOPPLER ULTRASOUND   TECHNIQUE: Gray-scale sonography with graded compression, as well as color Doppler and duplex ultrasound were performed to evaluate the lower extremity deep venous systems from the level of the common  femoral vein and including the common femoral, femoral, profunda femoral, popliteal and calf veins including the posterior tibial, peroneal and gastrocnemius veins when visible. The superficial great saphenous vein was also interrogated. Spectral Doppler was utilized to evaluate flow at rest and with distal augmentation maneuvers in the common femoral, femoral and popliteal veins.   COMPARISON:  None Available.   FINDINGS: Contralateral Common Femoral Vein: Respiratory phasicity is normal and symmetric with the symptomatic side. No evidence of thrombus. Normal compressibility.   Common Femoral Vein: No evidence of thrombus. Normal compressibility, respiratory phasicity and response to augmentation.   Saphenofemoral Junction: No evidence of thrombus. Normal compressibility and flow on color Doppler imaging.   Profunda Femoral Vein: No evidence of thrombus. Normal compressibility and flow on color Doppler imaging.   Femoral Vein: No evidence of thrombus. Normal compressibility, respiratory phasicity and response to augmentation.   Popliteal Vein: No evidence of thrombus. Normal compressibility, respiratory phasicity and response to augmentation.   Calf Veins: The RIGHT peroneal vein is limited in visualization secondary to surrounding edema. No evidence of thrombus within the RIGHT posterior tibial vein. Normal compressibility and flow on color Doppler imaging.   Superficial Great Saphenous Vein: No evidence of thrombus. Normal compressibility.   Venous Reflux:  None.   Other Findings: A 1.4 cm x 0.6 cm x 2.9 cm cyst is seen within the soft tissue structures of the RIGHT popliteal fossa.   Right groin lymph nodes are seen. The largest measures approximately 4.1 cm x 1.4 cm x 3.0 cm.   IMPRESSION: 1. Limited evaluation of the RIGHT peroneal vein without evidence of deep venous thrombosis within the RIGHT lower extremity. 2. RIGHT Baker's cyst.   CURRENT  MEDICATIONS Statin:  No. Beta Blocker:  No. Aspirin:  No. ACEI:  No. ARB:  No. CCB use:  No Other antiplatelets/anticoagulants:  No.    ASSESSMENT/PLAN: This is a 38 y.o. male who presents to the ED with a 2-week history of shortness of breath.  He endorses recently has had some right leg pain.  He also endorses that he has a family history of blood clots.  Upon workup patient underwent CTA PE that revealed a large saddle embolus with a large pulmonary arterial emboli attic burden and RV to LV ratio of 1.15.  Patient also underwent right lower extremity venous ultrasound which was negative for DVT but did show a Baker's cyst.  On exam the patient was resting comfortably in the emergency department with a heparin infusion running.  He ambulated to the bathroom and got notably short of breath with oxygen saturation going  down into the 90s.  Heart rate has been consistently tachycardic.  Therefore vascular surgery plans on taking the patient to the vascular lab on 03/20/2023 for a pulmonary thrombectomy.  I discussed in detail with the patient at the bedside this morning the procedure, benefits, risk, and complications.  He verbalizes understanding and wishes to proceed.  I answered all the patient's questions this morning.  Patient will be made n.p.o. for procedure later today.   -I discussed the case in detail with Dr. Festus Barren MD and he agrees with plan.   Marcie [facility] 03/20/2023 7:28 AM

## 2023-03-20 NOTE — Progress Notes (Signed)
 Progress Note   Patient: Peter Sparks:[id number] DOB: 1985/01/05 DOA: 03/19/2023     1 DOS: the patient was seen and examined on 03/20/2023   Brief hospital course: Peter Sparks is a 38 y.o. male with medical history significant for Tobacco use disorder, morbid obesity and history of depression who presents to the ED with a 2-week history of shortness of breath.   CTA PE protocol showed saddle embolus with large pulmonary artery embolic burden, RV strain.  Right lower extremity ultrasound negative for DVT showed Baker's cyst.  Patient is started on heparin infusion, vascular surgery consulted.  Assessment and Plan: * Acute saddle pulmonary embolism with acute cor pulmonale (HCC) CTA PE protocol saddle embolus with large pulmonary arterial embolic burden, RV strain with RV LV ratio 1.15 Continue heparin drip as per weight-based protocol.  Monitor APTT. Vascular surgery to take him for thrombectomy this afternoon Patient will be NPO for procedure. Close hemodynamic monitoring in stepdown given risk of clinical deterioration Echocardiogram pending  Baker's cyst of knee, right Patient had right leg pain.  Baker's cyst on ultrasound, no DVT  Tobacco use disorder Nicotine patch  Counseled regarding smoking cessation  Morbid obesity with BMI of 50.0-59.9, adult (HCC) Complicating factor to overall prognosis Diet, exercise and weight reduction along with lifestyle modifications advised.    Out of bed to chair. Incentive spirometry. Nursing supportive care. Fall, aspiration precautions. Diet:  Diet Orders (From admission, onward)     Start     Ordered   03/20/23 0001  Diet NPO time specified Except for: Sips with Meds  Diet effective midnight       Question:  Except for  Answer:  Sips with Meds   03/19/23 2344           DVT prophylaxis: Heparin drip  Level of care: Stepdown unit   Code Status: Full Code  Subjective: Patient is seen and examined today morning.  He  is sleeping comfortably, denies any shortness of breath or chest discomfort.  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  Physical Exam: Vitals:   03/20/23 0830 03/20/23 1147 03/20/23 1230 03/20/23 1508  BP: 118/83 (!) 141/93 123/86 (!) 144/97  Pulse: 87 91 84 95  Resp: (!) 22 12 13 19   Temp:  98.9 F (37.2 C)  98.3 F (36.8 C)  TempSrc:  Oral  Oral  SpO2: 96% 99% 97% 97%  Weight:      Height:        General -Young African-American morbidly obese male, no apparent distress HEENT - PERRLA, EOMI, atraumatic head, non tender sinuses. Lung -distant, basal rales, diffuse rhonchi, no wheezes. Heart - S1, S2 heard, no murmurs, rubs, trace pedal edema. Abdomen - Soft, non tender, obese, bowel sounds good Neuro - Alert, awake and oriented x 3, non focal exam. Skin - Warm and dry.  Data Reviewed:      Latest Ref Rng & Units 03/19/2023    4:10 PM 07/22/2019    5:35 PM 07/10/2014   11:28 PM  CBC  WBC 4.0 - 10.5 K/uL 12.0  7.7  13.2   Hemoglobin 13.0 - 17.0 g/dL 34.7  42.5  95.6   Hematocrit 39.0 - 52.0 % 40.7  43.1  38.0   Platelets 150 - 400 K/uL 221  327  280       Latest Ref Rng & Units 03/19/2023    4:10 PM 07/22/2019    5:35 PM 07/10/2014   11:28 PM  BMP  Glucose 70 - 99 mg/dL 884  91  166   BUN 6 - 20 mg/dL 17  11  14    Creatinine 0.61 - 1.24 mg/dL 0.63  0.16  0.10   Sodium 135 - 145 mmol/L 137  137  134   Potassium 3.5 - 5.1 mmol/L 3.6  4.2  3.0   Chloride 98 - 111 mmol/L 105  104  102   CO2 22 - 32 mmol/L 20  26  22    Calcium 8.9 - 10.3 mg/dL 9.1  9.1  8.2    ECHOCARDIOGRAM COMPLETE Result Date: 03/20/2023    ECHOCARDIOGRAM REPORT   Patient Name:   Peter Sparks Date of Exam: 03/20/2023 Medical Rec #:  [id number]       Height:       73.0 in Accession #:    [id number]      Weight:       395.4 lb Date of Birth:  1985-02-08      BSA:          2.875 m Patient Age:    37 years        BP:           118/83 mmHg Patient Gender: M               HR:           87 bpm. Exam  Location:  [facility] Procedure: 2D Echo, Cardiac Doppler, Color Doppler and Strain Analysis (Both            Spectral and Color Flow Doppler were utilized during procedure). Indications:     Pulmonary embolus I26.09  History:         Patient has no prior history of Echocardiogram examinations. No                  past medical history on file.  Sonographer:     Cristela Blue Referring Phys:  [id number] Andris Baumann Diagnosing Phys: Julien Nordmann MD  Sonographer Comments: Global longitudinal strain was attempted. IMPRESSIONS  1. Left ventricular ejection fraction, by estimation, is 30 to 35%. Left ventricular ejection fraction by 3D volume is 28 %. Left ventricular ejection fraction by PLAX is 32 %. The left ventricle has moderately decreased function. The left ventricle demonstrates global hypokinesis. Left ventricular diastolic parameters are consistent with Grade I diastolic dysfunction (impaired relaxation). The average left ventricular global longitudinal strain is -4.5 %. The global longitudinal strain is abnormal.  2. Right ventricular systolic function is normal. The right ventricular size is normal. There is normal pulmonary artery systolic pressure. The estimated right ventricular systolic pressure is 16.8 mmHg.  3. The mitral valve is normal in structure. No evidence of mitral valve regurgitation. No evidence of mitral stenosis.  4. The aortic valve is normal in structure. Aortic valve regurgitation is not visualized. No aortic stenosis is present.  5. The inferior vena cava is normal in size with greater than 50% respiratory variability, suggesting right atrial pressure of 3 mmHg. FINDINGS  Left Ventricle: Left ventricular ejection fraction, by estimation, is 30 to 35%. Left ventricular ejection fraction by PLAX is 32 %. Left ventricular ejection fraction by 3D volume is 28 %. The left ventricle has moderately decreased function. The left ventricle demonstrates global hypokinesis. The average left ventricular  global longitudinal strain is -4.5 %. Strain was performed and the global longitudinal strain is abnormal. The left ventricular internal cavity size was normal in size. There is  no left ventricular hypertrophy. Left ventricular diastolic parameters are consistent with Grade I diastolic dysfunction (impaired relaxation). Right Ventricle: The right ventricular size is normal. No increase in right ventricular wall thickness. Right ventricular systolic function is normal. There is normal pulmonary artery systolic pressure. The tricuspid regurgitant velocity is 1.72 m/s, and  with an assumed right atrial pressure of 5 mmHg, the estimated right ventricular systolic pressure is 16.8 mmHg. Left Atrium: Left atrial size was normal in size. Right Atrium: Right atrial size was normal in size. Pericardium: There is no evidence of pericardial effusion. Mitral Valve: The mitral valve is normal in structure. No evidence of mitral valve regurgitation. No evidence of mitral valve stenosis. Tricuspid Valve: The tricuspid valve is normal in structure. Tricuspid valve regurgitation is not demonstrated. No evidence of tricuspid stenosis. Aortic Valve: The aortic valve is normal in structure. Aortic valve regurgitation is not visualized. No aortic stenosis is present. Aortic valve mean gradient measures 2.0 mmHg. Aortic valve peak gradient measures 4.0 mmHg. Aortic valve area, by VTI measures 2.41 cm. Pulmonic Valve: The pulmonic valve was normal in structure. Pulmonic valve regurgitation is not visualized. No evidence of pulmonic stenosis. Aorta: The aortic root is normal in size and structure. Venous: The inferior vena cava is normal in size with greater than 50% respiratory variability, suggesting right atrial pressure of 3 mmHg. IAS/Shunts: No atrial level shunt detected by color flow Doppler. Additional Comments: 3D was performed not requiring image post processing on an independent workstation and was abnormal.  LEFT VENTRICLE  PLAX 2D LV EF:         Left            Diastology                ventricular     LV e' medial:    6.42 cm/s                ejection        LV E/e' medial:  8.6                fraction by     LV e' lateral:   12.80 cm/s                PLAX is 32      LV E/e' lateral: 4.3                %. LVIDd:         5.20 cm         2D Longitudinal LVIDs:         4.40 cm         Strain LV PW:         1.50 cm         2D Strain GLS   -4.5 % LV IVS:        1.10 cm         Avg: LVOT diam:     2.30 cm LV SV:         39              3D Volume EF LV SV Index:   14              LV 3D EF:    Left LVOT Area:     4.15 cm                     ventricul  ar                                             ejection LV Volumes (MOD)                            fraction LV vol d, MOD    160.0 ml                   by 3D A2C:                                        volume is LV vol d, MOD    149.0 ml                   28 %. A4C: LV vol s, MOD    125.0 ml A4C:                           3D Volume EF: LV SV MOD A4C:   149.0 ml      3D EF:        28 % RIGHT VENTRICLE RV Basal diam:  3.70 cm RV Mid diam:    3.20 cm LEFT ATRIUM             Index        RIGHT ATRIUM           Index LA diam:        3.50 cm 1.22 cm/m   RA Area:     12.50 cm LA Vol (A2C):   41.8 ml 14.54 ml/m  RA Volume:   25.50 ml  8.87 ml/m LA Vol (A4C):   33.8 ml 11.76 ml/m LA Biplane Vol: 38.8 ml 13.50 ml/m  AORTIC VALVE AV Area (Vmax):    2.39 cm AV Area (Vmean):   2.30 cm AV Area (VTI):     2.41 cm AV Vmax:           100.00 cm/s AV Vmean:          69.300 cm/s AV VTI:            0.163 m AV Peak Grad:      4.0 mmHg AV Mean Grad:      2.0 mmHg LVOT Vmax:         57.50 cm/s LVOT Vmean:        38.300 cm/s LVOT VTI:          0.095 m LVOT/AV VTI ratio: 0.58  AORTA Ao Root diam: 3.40 cm MITRAL VALVE               TRICUSPID VALVE MV Area (PHT): 5.46 cm    TR Peak grad:   11.8 mmHg MV Decel Time: 139 msec    TR Vmax:        172.00 cm/s MV E velocity:  55.50 cm/s MV A velocity: 69.70 cm/s  SHUNTS MV E/A ratio:  0.80        Systemic VTI:  0.09 m                            Systemic Diam: 2.30 cm Marcial Pacas  Mariah Milling MD Electronically signed by Julien Nordmann MD Signature Date/Time: 03/20/2023/3:14:11 PM    Final    CT Angio Chest PE W/Cm &/Or Wo Cm Result Date: 03/19/2023 CLINICAL DATA:  Shortness of breath with right lower extremity swelling and burning. Suspected PE, high probability. EXAM: CT ANGIOGRAPHY CHEST WITH CONTRAST TECHNIQUE: Multidetector CT imaging of the chest was performed using the standard protocol during bolus administration of intravenous contrast. Multiplanar CT image reconstructions and MIPs were obtained to evaluate the vascular anatomy. RADIATION DOSE REDUCTION: This exam was performed according to the departmental dose-optimization program which includes automated exposure control, adjustment of the mA and/or kV according to patient size and/or use of iterative reconstruction technique. CONTRAST:  OMNIPAQUE IOHEXOL 350 MG/ML SOLN COMPARISON:  PA and lateral chest today, chest radiograph 07/10/2014. FINDINGS: Cardiovascular: The pulmonary trunk is prominent indicating arterial hypertension measuring 3.5 cm. There is a large pulmonary arterial embolic burden. There is an acute saddle embolus partially occluding both distal main bronchi, with near occlusive emboli in the right greater than left lower lobe main arteries with extension into the segmental lower lobe arteries and scattered down stream subsegmental arteries. There are segmental and subsegmental emboli in both upper and lower lobes as well, with near occlusive thrombus in the interlobar pulmonary artery and the medial and lateral segmental arteries to the right middle lobe. There is an elevated RV/LV ratio 1.15 indicating right heart strain. The heart is slightly enlarged. There is no pericardial effusion. The aorta and great vessels are normal with normal variant short-segment  brachiobicarotid trunk and 2 vessel aortic arch. The pulmonary veins are not distended. Mediastinum/Nodes: No enlarged mediastinal, hilar, or axillary lymph nodes. The lower poles of the thyroid gland, trachea, and esophagus demonstrate no significant findings. Lungs/Pleura: Lungs are clear. No pleural effusion or pneumothorax. Upper Abdomen: No acute findings. Musculoskeletal: No chest wall abnormality to the extent visualized. The entire chest wall could not be included in the field. No acute or significant osseous findings. Review of the MIP images confirms the above findings. IMPRESSION: 1. Large pulmonary arterial embolic burden with saddle embolus and near occlusive emboli in the right greater than left lower lobe main arteries, with extension into the segmental and subsegmental arteries in both upper and lower lobes, main and segmental arteries in the right middle lobe. 2. Positive for acute PE with CT evidence of right heart strain (RV/LV Ratio = 1.15) consistent with at least submassive (intermediate risk) PE. The presence of right heart strain has been associated with an increased risk of morbidity and mortality. Please refer to the "Code PE Focused" order set in EPIC. 3. Prominent pulmonary trunk. 4. Mild cardiomegaly. 5. No other acute chest CT findings. 6. Critical Value/emergent results were called by telephone at the time of interpretation on 03/19/2023 at 11:10 pm to provider [facility] , who verbally acknowledged these results. Electronically Signed   By: Almira Bar M.D.   On: 03/19/2023 23:20   US Venous Img Lower Unilateral Right Result Date: 03/19/2023 CLINICAL DATA:  Shortness of breath and right leg swelling. EXAM: RIGHT LOWER EXTREMITY VENOUS DOPPLER ULTRASOUND TECHNIQUE: Gray-scale sonography with graded compression, as well as color Doppler and duplex ultrasound were performed to evaluate the lower extremity deep venous systems from the level of the common femoral vein and  including the common femoral, femoral, profunda femoral, popliteal and calf veins including the posterior tibial, peroneal and gastrocnemius veins when visible. The superficial great saphenous vein was also interrogated. Spectral Doppler was  utilized to evaluate flow at rest and with distal augmentation maneuvers in the common femoral, femoral and popliteal veins. COMPARISON:  None Available. FINDINGS: Contralateral Common Femoral Vein: Respiratory phasicity is normal and symmetric with the symptomatic side. No evidence of thrombus. Normal compressibility. Common Femoral Vein: No evidence of thrombus. Normal compressibility, respiratory phasicity and response to augmentation. Saphenofemoral Junction: No evidence of thrombus. Normal compressibility and flow on color Doppler imaging. Profunda Femoral Vein: No evidence of thrombus. Normal compressibility and flow on color Doppler imaging. Femoral Vein: No evidence of thrombus. Normal compressibility, respiratory phasicity and response to augmentation. Popliteal Vein: No evidence of thrombus. Normal compressibility, respiratory phasicity and response to augmentation. Calf Veins: The RIGHT peroneal vein is limited in visualization secondary to surrounding edema. No evidence of thrombus within the RIGHT posterior tibial vein. Normal compressibility and flow on color Doppler imaging. Superficial Great Saphenous Vein: No evidence of thrombus. Normal compressibility. Venous Reflux:  None. Other Findings: A 1.4 cm x 0.6 cm x 2.9 cm cyst is seen within the soft tissue structures of the RIGHT popliteal fossa. Right groin lymph nodes are seen. The largest measures approximately 4.1 cm x 1.4 cm x 3.0 cm. IMPRESSION: 1. Limited evaluation of the RIGHT peroneal vein without evidence of deep venous thrombosis within the RIGHT lower extremity. 2. RIGHT Baker's cyst. Electronically Signed   By: Aram Candela M.D.   On: 03/19/2023 19:20   DG Chest 2 View Result Date:  03/19/2023 CLINICAL DATA:  COPD.  Shortness of breath. EXAM: CHEST - 2 VIEW COMPARISON:  Chest radiograph dated 07/10/2014. FINDINGS: The heart size and mediastinal contours are within normal limits. Both lungs are clear. The visualized skeletal structures are unremarkable. IMPRESSION: No active cardiopulmonary disease. Electronically Signed   By: Elgie Collard M.D.   On: 03/19/2023 17:51    Family Communication: Discussed with patient, he understand and agree. All questions answered.  Disposition: Status is: Inpatient Remains inpatient appropriate because: Pulmonary embolism, thrombectomy  Planned Discharge Destination: Home     MDM level 3-patient has significant PE clot burden, RV strain.  Patient will need IV heparin drip, vascular surgery evaluation for thrombectomy and possible transfer to stepdown unit.  Patient will need close hemodynamic, neurologic and telemetry monitoring.  He is at high risk for sudden clinical deterioration.  Author: Marcelino Duster, MD 03/20/2023 3:32 PM Secure chat 7am to 7pm For on call review [email or web address].

## 2023-03-20 NOTE — Assessment & Plan Note (Signed)
 Patient had right leg pain.  Baker's cyst on ultrasound, no DVT

## 2023-03-20 NOTE — Progress Notes (Signed)
 Pt reported left ac iv bothering him, requested removal.

## 2023-03-21 ENCOUNTER — Encounter: Payer: Self-pay | Admitting: Vascular Surgery

## 2023-03-21 ENCOUNTER — Other Ambulatory Visit: Payer: Self-pay

## 2023-03-21 DIAGNOSIS — F17210 Nicotine dependence, cigarettes, uncomplicated: Secondary | ICD-10-CM | POA: Diagnosis not present

## 2023-03-21 DIAGNOSIS — I5021 Acute systolic (congestive) heart failure: Secondary | ICD-10-CM

## 2023-03-21 DIAGNOSIS — I2602 Saddle embolus of pulmonary artery with acute cor pulmonale: Secondary | ICD-10-CM | POA: Diagnosis not present

## 2023-03-21 DIAGNOSIS — I5020 Unspecified systolic (congestive) heart failure: Secondary | ICD-10-CM | POA: Diagnosis not present

## 2023-03-21 LAB — HOMOCYSTEINE: Homocysteine: 14.4 umol/L (ref 0.0–14.5)

## 2023-03-21 LAB — CARDIOLIPIN ANTIBODIES, IGG, IGM, IGA
Anticardiolipin IgA: <9 U/mL (ref 0–11)
Anticardiolipin IgG: <9 GPL U/mL (ref 0–14)
Anticardiolipin IgM: <9 [MPL'U]/mL (ref 0–12)

## 2023-03-21 LAB — CBC
HCT: 34.5 % — ABNORMAL LOW (ref 39.0–52.0)
Hemoglobin: 11.3 g/dL — ABNORMAL LOW (ref 13.0–17.0)
MCH: 27.3 pg (ref 26.0–34.0)
MCHC: 32.8 g/dL (ref 30.0–36.0)
MCV: 83.3 fL (ref 80.0–100.0)
Platelets: 219 10*3/uL (ref 150–400)
RBC: 4.14 MIL/uL — ABNORMAL LOW (ref 4.22–5.81)
RDW: 13.6 % (ref 11.5–15.5)
WBC: 9.8 10*3/uL (ref 4.0–10.5)
nRBC: 0 % (ref 0.0–0.2)

## 2023-03-21 LAB — BASIC METABOLIC PANEL
Anion gap: 9 (ref 5–15)
BUN: 15 mg/dL (ref 6–20)
CO2: 22 mmol/L (ref 22–32)
Calcium: 8.5 mg/dL — ABNORMAL LOW (ref 8.9–10.3)
Chloride: 105 mmol/L (ref 98–111)
Creatinine, Ser: 1.08 mg/dL (ref 0.61–1.24)
GFR, Estimated: >60 mL/min (ref >60)
Glucose, Bld: 124 mg/dL — ABNORMAL HIGH (ref 70–99)
Potassium: 3.6 mmol/L (ref 3.5–5.1)
Sodium: 136 mmol/L (ref 135–145)

## 2023-03-21 LAB — HEPARIN LEVEL (UNFRACTIONATED)
Heparin Unfractionated: 0.25 [IU]/mL — ABNORMAL LOW (ref 0.30–0.70)
Heparin Unfractionated: 0.46 [IU]/mL (ref 0.30–0.70)

## 2023-03-21 LAB — BETA-2-GLYCOPROTEIN I ABS, IGG/M/A
Beta-2 Glyco I IgG: <9 GPI IgG units (ref 0–20)
Beta-2-Glycoprotein I IgA: <9 GPI IgA units (ref 0–25)
Beta-2-Glycoprotein I IgM: <9 GPI IgM units (ref 0–32)

## 2023-03-21 LAB — HEMOGLOBIN A1C
Hgb A1c MFr Bld: 4.8 % (ref 4.8–5.6)
Mean Plasma Glucose: 91.06 mg/dL

## 2023-03-21 MED ORDER — APIXABAN 5 MG PO TABS
10.0000 mg | ORAL_TABLET | Freq: Two times a day (BID) | ORAL | Status: DC
  Administered 2023-03-21 – 2023-03-22 (×3): 10 mg via ORAL
  Filled 2023-03-21 (×3): qty 2

## 2023-03-21 MED ORDER — SPIRONOLACTONE 12.5 MG HALF TABLET
12.5000 mg | ORAL_TABLET | Freq: Every day | ORAL | Status: DC
  Administered 2023-03-22: 12.5 mg via ORAL
  Filled 2023-03-21: qty 1

## 2023-03-21 MED ORDER — LOSARTAN POTASSIUM 50 MG PO TABS
50.0000 mg | ORAL_TABLET | Freq: Every day | ORAL | Status: DC
  Administered 2023-03-21 – 2023-03-22 (×2): 50 mg via ORAL
  Filled 2023-03-21 (×2): qty 1

## 2023-03-21 MED ORDER — APIXABAN 5 MG PO TABS: 5.0000 mg | ORAL_TABLET | Freq: Two times a day (BID) | ORAL | Status: DC

## 2023-03-21 MED ORDER — METOPROLOL SUCCINATE ER 25 MG PO TB24
25.0000 mg | ORAL_TABLET | Freq: Every day | ORAL | Status: DC
  Administered 2023-03-21 – 2023-03-22 (×2): 25 mg via ORAL
  Filled 2023-03-21 (×2): qty 1

## 2023-03-21 MED ORDER — SACUBITRIL-VALSARTAN 24-26 MG PO TABS: 1.0000 | ORAL_TABLET | Freq: Two times a day (BID) | ORAL | Status: DC

## 2023-03-21 MED ORDER — HEPARIN BOLUS VIA INFUSION
3700.0000 [IU] | Freq: Once | INTRAVENOUS | Status: AC
  Administered 2023-03-21: 3700 [IU] via INTRAVENOUS
  Filled 2023-03-21: qty 3700

## 2023-03-21 NOTE — Progress Notes (Signed)
 ANTICOAGULATION CONSULT NOTE  Pharmacy Consult for heparin infusion Indication: pulmonary embolus  No Known Allergies  Patient Measurements: Height: 6\' 1"  (185.4 cm) Weight: (!) 179.4 kg (395 lb 6.4 oz) IBW/kg (Calculated) : 79.9 Heparin Dosing Weight: 124.3 kg  Vital Signs: Temp: 98 F (36.7 C) (03/19 2303) Temp Source: Oral (03/19 2012) BP: 157/90 (03/19 2303) Pulse Rate: 100 (03/19 2303)  Labs: Recent Labs    03/19/23 1610 03/19/23 2208 03/19/23 2347 03/20/23 0640 03/20/23 1355 03/20/23 2352  HGB 13.0  --   --   --   --   --   HCT 40.7  --   --   --   --   --   PLT 221  --   --   --   --   --   APTT  --   --  30  --   --   --   LABPROT  --   --  14.3  --   --   --   INR  --   --  1.1  --   --   --   HEPARINUNFRC  --   --   --  0.38 0.30 0.25*  CREATININE 1.12  --   --   --   --   --   TROPONINIHS 19* 15  --   --   --   --     Estimated Creatinine Clearance: 155.9 mL/min (by C-G formula based on SCr of 1.12 mg/dL).   Medical History: History reviewed. No pertinent past medical history.  Assessment: Pt is a 38 yo male presenting to ED for "SOB and right leg swelling/burning since Saturday," found with "large pulmonary arterial embolic burden with saddle embolus and near occlusive emboli in the right greater than left lower lobe main arteries, with extension into the segmental and subsegmental arteries in both upper and lower lobes, main and segmental arteries in the right middle lobe."  Goal of Therapy:  Heparin level 0.3-0.7 units/ml Monitor platelets by anticoagulation protocol: Yes   3/19 0640 HL 0.38, therapeutic x 1 3/19 1355 HL 0.3, therapeutic x 2 but borderline subtherapeutic 3/19 0352 HL 0.25, subtherapeutic  Plan:  Bolus 3700 units x 1 Increase heparin infusion to 2350 units/hr  Recheck HL in 6 hrs after rate change  CBC daily while on heparin  Otelia Sergeant, PharmD, [facility] 03/21/2023 1:07 AM

## 2023-03-21 NOTE — Progress Notes (Signed)
 Progress Note   Patient: Peter Sparks:[id number] DOB: 1985/08/28 DOA: 03/19/2023     2 DOS: the patient was seen and examined on 03/21/2023   Brief hospital course: Peter Sparks is a 38 y.o. male with medical history significant for Tobacco use disorder, morbid obesity and history of depression who presents to the ED with a 2-week history of shortness of breath.   CTA PE protocol showed saddle embolus with large pulmonary artery embolic burden, RV strain.  Right lower extremity ultrasound negative for DVT showed Baker's cyst.  Patient is started on heparin infusion, vascular surgery consulted.  Assessment and Plan: * Acute saddle pulmonary embolism with acute cor pulmonale (HCC) New reduced EF CTA PE protocol saddle embolus with large pulmonary arterial embolic burden, RV strain with RV LV ratio 1.15 Vascular surgery performed thrombectomy 03/20/23. He tolerated the procedure well. Echocardiogram revealed reduced EF at 30-35% with global hypokinesis. I called cardiology for evaluation.  He does not have insurance will need assistance with GDMT meds and eliquis. Heparin drip transitioned to Eliquis per pharmacy protocol.  Baker's cyst of knee, right Patient had right leg pain.  Baker's cyst on ultrasound, no DVT  Tobacco use disorder Nicotine patch  Counseled regarding smoking cessation  Morbid obesity with BMI of 50.0-59.9, adult (HCC) Complicating factor to overall prognosis Diet, exercise and weight reduction along with lifestyle modifications advised.    Out of bed to chair. Incentive spirometry. Nursing supportive care. Fall, aspiration precautions. Diet:  Diet Orders (From admission, onward)     Start     Ordered   03/20/23 1735  Diet regular Room service appropriate? Yes; Fluid consistency: Thin  Diet effective now       Question Answer Comment  Room service appropriate? Yes   Fluid consistency: Thin      03/20/23 1734           DVT prophylaxis:  Heparin drip apixaban (ELIQUIS) tablet 10 mg  apixaban (ELIQUIS) tablet 5 mg  Level of care: Stepdown unit   Code Status: Full Code  Subjective: Patient is seen and examined today morning.  He is sitting comfortably, denies any shortness of breath or chest discomfort.  Feels weak. Advised to work with PT. Pharmacy at bedside discussing med assistance program.  Physical Exam: Vitals:   03/21/23 0549 03/21/23 0800 03/21/23 1223 03/21/23 1516  BP: 139/77 (!) 144/96 137/75 138/77  Pulse: 81 85  83  Resp: 19     Temp: 98.3 F (36.8 C) (!) 96.9 F (36.1 C) 97.8 F (36.6 C) 98.2 F (36.8 C)  TempSrc:   Oral   SpO2: 99% 100%  100%  Weight:      Height:        General -Young African-American morbidly obese male, no apparent distress HEENT - PERRLA, EOMI, atraumatic head, non tender sinuses. Lung -distant, basal rales, diffuse rhonchi, no wheezes. Heart - S1, S2 heard, no murmurs, rubs, trace pedal edema. Abdomen - Soft, non tender, obese, bowel sounds good Neuro - Alert, awake and oriented x 3, non focal exam. Skin - Warm and dry.  Data Reviewed:      Latest Ref Rng & Units 03/21/2023    5:14 AM 03/19/2023    4:10 PM 07/22/2019    5:35 PM  CBC  WBC 4.0 - 10.5 K/uL 9.8  12.0  7.7   Hemoglobin 13.0 - 17.0 g/dL 95.6  21.3  08.6   Hematocrit 39.0 - 52.0 % 34.5  40.7  43.1  Platelets 150 - 400 K/uL 219  221  327       Latest Ref Rng & Units 03/21/2023    5:14 AM 03/19/2023    4:10 PM 07/22/2019    5:35 PM  BMP  Glucose 70 - 99 mg/dL 601  093  91   BUN 6 - 20 mg/dL 15  17  11    Creatinine 0.61 - 1.24 mg/dL 2.35  5.73  2.20   Sodium 135 - 145 mmol/L 136  137  137   Potassium 3.5 - 5.1 mmol/L 3.6  3.6  4.2   Chloride 98 - 111 mmol/L 105  105  104   CO2 22 - 32 mmol/L 22  20  26    Calcium 8.9 - 10.3 mg/dL 8.5  9.1  9.1    PERIPHERAL VASCULAR CATHETERIZATION Result Date: 03/20/2023 See surgical note for result.  ECHOCARDIOGRAM COMPLETE Result Date: 03/20/2023     ECHOCARDIOGRAM REPORT   Patient Name:   Peter Sparks Date of Exam: 03/20/2023 Medical Rec #:  [id number]       Height:       73.0 in Accession #:    [id number]      Weight:       395.4 lb Date of Birth:  [birth year]      BSA:          2.875 m Patient Age:    37 years        BP:           118/83 mmHg Patient Gender: M               HR:           87 bpm. Exam Location:  [facility] Procedure: 2D Echo, Cardiac Doppler, Color Doppler and Strain Analysis (Both            Spectral and Color Flow Doppler were utilized during procedure). Indications:     Pulmonary embolus I26.09  History:         Patient has no prior history of Echocardiogram examinations. No                  past medical history on file.  Sonographer:     Cristela Blue Referring Phys:  [id number] Andris Baumann Diagnosing Phys: Julien Nordmann MD  Sonographer Comments: Global longitudinal strain was attempted. IMPRESSIONS  1. Left ventricular ejection fraction, by estimation, is 30 to 35%. Left ventricular ejection fraction by 3D volume is 28 %. Left ventricular ejection fraction by PLAX is 32 %. The left ventricle has moderately decreased function. The left ventricle demonstrates global hypokinesis. Left ventricular diastolic parameters are consistent with Grade I diastolic dysfunction (impaired relaxation). The average left ventricular global longitudinal strain is -4.5 %. The global longitudinal strain is abnormal.  2. Right ventricular systolic function is normal. The right ventricular size is normal. There is normal pulmonary artery systolic pressure. The estimated right ventricular systolic pressure is 16.8 mmHg.  3. The mitral valve is normal in structure. No evidence of mitral valve regurgitation. No evidence of mitral stenosis.  4. The aortic valve is normal in structure. Aortic valve regurgitation is not visualized. No aortic stenosis is present.  5. The inferior vena cava is normal in size with greater than 50% respiratory variability, suggesting right  atrial pressure of 3 mmHg. FINDINGS  Left Ventricle: Left ventricular ejection fraction, by estimation, is 30 to 35%. Left ventricular ejection fraction by PLAX is 32 %. Left ventricular ejection  fraction by 3D volume is 28 %. The left ventricle has moderately decreased function. The left ventricle demonstrates global hypokinesis. The average left ventricular global longitudinal strain is -4.5 %. Strain was performed and the global longitudinal strain is abnormal. The left ventricular internal cavity size was normal in size. There is no left ventricular hypertrophy. Left ventricular diastolic parameters are consistent with Grade I diastolic dysfunction (impaired relaxation). Right Ventricle: The right ventricular size is normal. No increase in right ventricular wall thickness. Right ventricular systolic function is normal. There is normal pulmonary artery systolic pressure. The tricuspid regurgitant velocity is 1.72 m/s, and  with an assumed right atrial pressure of 5 mmHg, the estimated right ventricular systolic pressure is 16.8 mmHg. Left Atrium: Left atrial size was normal in size. Right Atrium: Right atrial size was normal in size. Pericardium: There is no evidence of pericardial effusion. Mitral Valve: The mitral valve is normal in structure. No evidence of mitral valve regurgitation. No evidence of mitral valve stenosis. Tricuspid Valve: The tricuspid valve is normal in structure. Tricuspid valve regurgitation is not demonstrated. No evidence of tricuspid stenosis. Aortic Valve: The aortic valve is normal in structure. Aortic valve regurgitation is not visualized. No aortic stenosis is present. Aortic valve mean gradient measures 2.0 mmHg. Aortic valve peak gradient measures 4.0 mmHg. Aortic valve area, by VTI measures 2.41 cm. Pulmonic Valve: The pulmonic valve was normal in structure. Pulmonic valve regurgitation is not visualized. No evidence of pulmonic stenosis. Aorta: The aortic root is normal in size  and structure. Venous: The inferior vena cava is normal in size with greater than 50% respiratory variability, suggesting right atrial pressure of 3 mmHg. IAS/Shunts: No atrial level shunt detected by color flow Doppler. Additional Comments: 3D was performed not requiring image post processing on an independent workstation and was abnormal.  LEFT VENTRICLE PLAX 2D LV EF:         Left            Diastology                ventricular     LV e' medial:    6.42 cm/s                ejection        LV E/e' medial:  8.6                fraction by     LV e' lateral:   12.80 cm/s                PLAX is 32      LV E/e' lateral: 4.3                %. LVIDd:         5.20 cm         2D Longitudinal LVIDs:         4.40 cm         Strain LV PW:         1.50 cm         2D Strain GLS   -4.5 % LV IVS:        1.10 cm         Avg: LVOT diam:     2.30 cm LV SV:         39              3D Volume EF LV SV Index:   14  LV 3D EF:    Left LVOT Area:     4.15 cm                     ventricul                                             ar                                             ejection LV Volumes (MOD)                            fraction LV vol d, MOD    160.0 ml                   by 3D A2C:                                        volume is LV vol d, MOD    149.0 ml                   28 %. A4C: LV vol s, MOD    125.0 ml A4C:                           3D Volume EF: LV SV MOD A4C:   149.0 ml      3D EF:        28 % RIGHT VENTRICLE RV Basal diam:  3.70 cm RV Mid diam:    3.20 cm LEFT ATRIUM             Index        RIGHT ATRIUM           Index LA diam:        3.50 cm 1.22 cm/m   RA Area:     12.50 cm LA Vol (A2C):   41.8 ml 14.54 ml/m  RA Volume:   25.50 ml  8.87 ml/m LA Vol (A4C):   33.8 ml 11.76 ml/m LA Biplane Vol: 38.8 ml 13.50 ml/m  AORTIC VALVE AV Area (Vmax):    2.39 cm AV Area (Vmean):   2.30 cm AV Area (VTI):     2.41 cm AV Vmax:           100.00 cm/s AV Vmean:          69.300 cm/s AV VTI:            0.163 m AV  Peak Grad:      4.0 mmHg AV Mean Grad:      2.0 mmHg LVOT Vmax:         57.50 cm/s LVOT Vmean:        38.300 cm/s LVOT VTI:          0.095 m LVOT/AV VTI ratio: 0.58  AORTA Ao Root diam: 3.40 cm MITRAL VALVE               TRICUSPID VALVE MV Area (PHT): 5.46 cm    TR Peak grad:   11.8 mmHg  MV Decel Time: 139 msec    TR Vmax:        172.00 cm/s MV E velocity: 55.50 cm/s MV A velocity: 69.70 cm/s  SHUNTS MV E/A ratio:  0.80        Systemic VTI:  0.09 m                            Systemic Diam: 2.30 cm Julien Nordmann MD Electronically signed by Julien Nordmann MD Signature Date/Time: 03/20/2023/3:14:11 PM    Final    CT Angio Chest PE W/Cm &/Or Wo Cm Result Date: 03/19/2023 CLINICAL DATA:  Shortness of breath with right lower extremity swelling and burning. Suspected PE, high probability. EXAM: CT ANGIOGRAPHY CHEST WITH CONTRAST TECHNIQUE: Multidetector CT imaging of the chest was performed using the standard protocol during bolus administration of intravenous contrast. Multiplanar CT image reconstructions and MIPs were obtained to evaluate the vascular anatomy. RADIATION DOSE REDUCTION: This exam was performed according to the departmental dose-optimization program which includes automated exposure control, adjustment of the mA and/or kV according to patient size and/or use of iterative reconstruction technique. CONTRAST:  OMNIPAQUE IOHEXOL 350 MG/ML SOLN COMPARISON:  PA and lateral chest today, chest radiograph 07/10/2014. FINDINGS: Cardiovascular: The pulmonary trunk is prominent indicating arterial hypertension measuring 3.5 cm. There is a large pulmonary arterial embolic burden. There is an acute saddle embolus partially occluding both distal main bronchi, with near occlusive emboli in the right greater than left lower lobe main arteries with extension into the segmental lower lobe arteries and scattered down stream subsegmental arteries. There are segmental and subsegmental emboli in both upper and lower  lobes as well, with near occlusive thrombus in the interlobar pulmonary artery and the medial and lateral segmental arteries to the right middle lobe. There is an elevated RV/LV ratio 1.15 indicating right heart strain. The heart is slightly enlarged. There is no pericardial effusion. The aorta and great vessels are normal with normal variant short-segment brachiobicarotid trunk and 2 vessel aortic arch. The pulmonary veins are not distended. Mediastinum/Nodes: No enlarged mediastinal, hilar, or axillary lymph nodes. The lower poles of the thyroid gland, trachea, and esophagus demonstrate no significant findings. Lungs/Pleura: Lungs are clear. No pleural effusion or pneumothorax. Upper Abdomen: No acute findings. Musculoskeletal: No chest wall abnormality to the extent visualized. The entire chest wall could not be included in the field. No acute or significant osseous findings. Review of the MIP images confirms the above findings. IMPRESSION: 1. Large pulmonary arterial embolic burden with saddle embolus and near occlusive emboli in the right greater than left lower lobe main arteries, with extension into the segmental and subsegmental arteries in both upper and lower lobes, main and segmental arteries in the right middle lobe. 2. Positive for acute PE with CT evidence of right heart strain (RV/LV Ratio = 1.15) consistent with at least submassive (intermediate risk) PE. The presence of right heart strain has been associated with an increased risk of morbidity and mortality. Please refer to the "Code PE Focused" order set in EPIC. 3. Prominent pulmonary trunk. 4. Mild cardiomegaly. 5. No other acute chest CT findings. 6. Critical Value/emergent results were called by telephone at the time of interpretation on 03/19/2023 at 11:10 pm to provider [facility] , who verbally acknowledged these results. Electronically Signed   By: Almira Bar M.D.   On: 03/19/2023 23:20   US Venous Img Lower Unilateral  Right Result Date: 03/19/2023 CLINICAL DATA:  Shortness of breath and right leg swelling. EXAM: RIGHT LOWER EXTREMITY VENOUS DOPPLER ULTRASOUND TECHNIQUE: Gray-scale sonography with graded compression, as well as color Doppler and duplex ultrasound were performed to evaluate the lower extremity deep venous systems from the level of the common femoral vein and including the common femoral, femoral, profunda femoral, popliteal and calf veins including the posterior tibial, peroneal and gastrocnemius veins when visible. The superficial great saphenous vein was also interrogated. Spectral Doppler was utilized to evaluate flow at rest and with distal augmentation maneuvers in the common femoral, femoral and popliteal veins. COMPARISON:  None Available. FINDINGS: Contralateral Common Femoral Vein: Respiratory phasicity is normal and symmetric with the symptomatic side. No evidence of thrombus. Normal compressibility. Common Femoral Vein: No evidence of thrombus. Normal compressibility, respiratory phasicity and response to augmentation. Saphenofemoral Junction: No evidence of thrombus. Normal compressibility and flow on color Doppler imaging. Profunda Femoral Vein: No evidence of thrombus. Normal compressibility and flow on color Doppler imaging. Femoral Vein: No evidence of thrombus. Normal compressibility, respiratory phasicity and response to augmentation. Popliteal Vein: No evidence of thrombus. Normal compressibility, respiratory phasicity and response to augmentation. Calf Veins: The RIGHT peroneal vein is limited in visualization secondary to surrounding edema. No evidence of thrombus within the RIGHT posterior tibial vein. Normal compressibility and flow on color Doppler imaging. Superficial Great Saphenous Vein: No evidence of thrombus. Normal compressibility. Venous Reflux:  None. Other Findings: A 1.4 cm x 0.6 cm x 2.9 cm cyst is seen within the soft tissue structures of the RIGHT popliteal fossa. Right groin  lymph nodes are seen. The largest measures approximately 4.1 cm x 1.4 cm x 3.0 cm. IMPRESSION: 1. Limited evaluation of the RIGHT peroneal vein without evidence of deep venous thrombosis within the RIGHT lower extremity. 2. RIGHT Baker's cyst. Electronically Signed   By: Aram Candela M.D.   On: 03/19/2023 19:20   DG Chest 2 View Result Date: 03/19/2023 CLINICAL DATA:  COPD.  Shortness of breath. EXAM: CHEST - 2 VIEW COMPARISON:  Chest radiograph dated 07/10/2014. FINDINGS: The heart size and mediastinal contours are within normal limits. Both lungs are clear. The visualized skeletal structures are unremarkable. IMPRESSION: No active cardiopulmonary disease. Electronically Signed   By: Elgie Collard M.D.   On: 03/19/2023 17:51    Family Communication: Discussed with patient, he understand and agree. All questions answered.  Disposition: Status is: Inpatient Remains inpatient appropriate because: Pulmonary embolism, s/p thrombectomy, Low EF on echo  Planned Discharge Destination: Home     Time spent 40 min.  Author: Marcelino Duster, MD 03/21/2023 3:53 PM Secure chat 7am to 7pm For on call review [email or web address].

## 2023-03-21 NOTE — Consult Note (Addendum)
 [facility] CLINIC CARDIOLOGY CONSULT NOTE       Patient ID: Peter Sparks MRN: [id number] DOB/AGE: 04-16-1985 38 y.o.  Admit date: 03/19/2023 Referring Physician Dr. Clide Dales Primary Physician Patient, No Pcp Per  Primary Cardiologist none Reason for Consultation reduced EF  HPI: Peter Sparks is a 38 y.o. male  with a past medical history of depression, morbid obesity, tobacco use who presented to the ED on 03/19/2023 for shortness of breath.  Found to have saddle PE and underwent mechanical thrombectomy.  EF found to be reduced on echo at 30-35%.  Cardiology was consulted for further evaluation.   Patient reports that about 2 weeks ago he began experiencing worsening shortness of breath and weakness.  Also reports that he had some lower extremity edema.  His symptoms continue to worsen and he decided to come to the ED for further evaluation.  Workup in the ED notable for creatinine 1.12, potassium 3.6, hemoglobin 13.0, WBC 12.0.  Troponins trended 19 > 15.  EKG with sinus tachycardia.  CTA chest revealed saddle PE and he was taken for mechanical thrombectomy.  Echo done during this admission revealed reduced EF at 30-35% with global hypokinesis.  He has no prior history of heart failure.  At the time of my evaluation this morning he is resting comfortably in hospital bed.  We discussed his symptoms in further detail.  He denies any chest pain now or recently.  He only had some back pain in the last 2 weeks.  He states that he had onset of shortness of breath about 2 weeks ago and this continued to get worse.  Also reports that he noticed some lower extremity swelling, and his right leg more than his left.  States that today he is feeling tired and weak.  States his shortness of breath is slightly improved, he is saturating well on room air.  Review of systems complete and found to be negative unless listed above    History reviewed. No pertinent past medical history.  Past Surgical History:   Procedure Laterality Date   PULMONARY THROMBECTOMY Bilateral 03/20/2023   Procedure: PULMONARY THROMBECTOMY;  Surgeon: Annice Needy, MD;  Location: [facility] INVASIVE CV LAB;  Service: Cardiovascular;  Laterality: Bilateral;    Medications Prior to Admission  Medication Sig Dispense Refill Last Dose/Taking   ketorolac (TORADOL) 10 MG tablet Take 1 tablet (10 mg total) by mouth every 8 (eight) hours. (Patient not taking: Reported on 03/20/2023) 15 tablet 0 Not Taking   ondansetron (ZOFRAN ODT) 4 MG disintegrating tablet Take 1 tablet (4 mg total) by mouth every 8 (eight) hours as needed. (Patient not taking: Reported on 03/20/2023) 15 tablet 0 Not Taking   Social History   Socioeconomic History   Marital status: Single    Spouse name: Not on file   Number of children: Not on file   Years of education: Not on file   Highest education level: Not on file  Occupational History   Not on file  Tobacco Use   Smoking status: Every Day   Smokeless tobacco: Never  Substance and Sexual Activity   Alcohol use: Yes   Drug use: Yes    Types: Marijuana    Comment: Molly   Sexual activity: Not Currently  Other Topics Concern   Not on file  Social History Narrative   Not on file   Social Drivers of Health   Financial Resource Strain: Not on file  Food Insecurity: No Food Insecurity (03/21/2023)  Hunger Vital Sign    Worried About Running Out of Food in the Last Year: Never true    Ran Out of Food in the Last Year: Never true  Transportation Needs: No Transportation Needs (03/21/2023)   PRAPARE - Administrator, Civil Service (Medical): No    Lack of Transportation (Non-Medical): No  Physical Activity: Not on file  Stress: Not on file  Social Connections: Not on file  Intimate Partner Violence: Not At Risk (03/21/2023)   Humiliation, Afraid, Rape, and Kick questionnaire    Fear of Current or Ex-Partner: No    Emotionally Abused: No    Physically Abused: No    Sexually Abused: No     History reviewed. No pertinent family history.   Vitals:   03/20/23 2012 03/20/23 2303 03/21/23 0549 03/21/23 0800  BP: (!) 141/94 (!) 157/90 139/77 (!) 144/96  Pulse: (!) 107 100 81 85  Resp:  19 19   Temp: 99.2 F (37.3 C) 98 F (36.7 C) 98.3 F (36.8 C) (!) 96.9 F (36.1 C)  TempSrc: Oral Oral    SpO2: 100% 98% 99% 100%  Weight:      Height:        PHYSICAL EXAM General: Well-appearing young male, well nourished, in no acute distress. HEENT: Normocephalic and atraumatic. Neck: No JVD.  Lungs: Normal respiratory effort on room air. Clear bilaterally to auscultation. No wheezes, crackles, rhonchi.  Heart: HRRR. Normal S1 and S2 without gallops or murmurs.  Abdomen: Non-distended appearing.  Msk: Normal strength and tone for age. Extremities: Warm and well perfused. No clubbing, cyanosis.  Trace edema.  Neuro: Alert and oriented X 3. Psych: Answers questions appropriately.   Labs: Basic Metabolic Panel: Recent Labs    03/19/23 1610 03/21/23 0514  NA 137 136  K 3.6 3.6  CL 105 105  CO2 20* 22  GLUCOSE 101* 124*  BUN 17 15  CREATININE 1.12 1.08  CALCIUM 9.1 8.5*   Liver Function Tests: Recent Labs    03/19/23 1610  AST 16  ALT 17  ALKPHOS 59  BILITOT 0.7  PROT 8.3*  ALBUMIN 3.4*   No results for input(s): "LIPASE", "AMYLASE" in the last 72 hours. CBC: Recent Labs    03/19/23 1610 03/21/23 0514  WBC 12.0* 9.8  HGB 13.0 11.3*  HCT 40.7 34.5*  MCV 85.3 83.3  PLT 221 219   Cardiac Enzymes: Recent Labs    03/19/23 1610 03/19/23 2208  TROPONINIHS 19* 15   BNP: No results for input(s): "BNP" in the last 72 hours. D-Dimer: No results for input(s): "DDIMER" in the last 72 hours. Hemoglobin A1C: No results for input(s): "HGBA1C" in the last 72 hours. Fasting Lipid Panel: No results for input(s): "CHOL", "HDL", "LDLCALC", "TRIG", "CHOLHDL", "LDLDIRECT" in the last 72 hours. Thyroid Function Tests: No results for input(s): "TSH", "T4TOTAL",  "T3FREE", "THYROIDAB" in the last 72 hours.  Invalid input(s): "FREET3" Anemia Panel: No results for input(s): "VITAMINB12", "FOLATE", "FERRITIN", "TIBC", "IRON", "RETICCTPCT" in the last 72 hours.   Radiology: PERIPHERAL VASCULAR CATHETERIZATION Result Date: 03/20/2023 See surgical note for result.  ECHOCARDIOGRAM COMPLETE Result Date: 03/20/2023    ECHOCARDIOGRAM REPORT   Patient Name:   Peter Sparks Date of Exam: 03/20/2023 Medical Rec #:  [id number]       Height:       73.0 in Accession #:    [id number]      Weight:       395.4 lb Date of Birth:  November 25, 1985      BSA:          2.875 m Patient Age:    37 years        BP:           118/83 mmHg Patient Gender: M               HR:           87 bpm. Exam Location:  [facility] Procedure: 2D Echo, Cardiac Doppler, Color Doppler and Strain Analysis (Both            Spectral and Color Flow Doppler were utilized during procedure). Indications:     Pulmonary embolus I26.09  History:         Patient has no prior history of Echocardiogram examinations. No                  past medical history on file.  Sonographer:     Cristela Blue Referring Phys:  [id number] Andris Baumann Diagnosing Phys: Julien Nordmann MD  Sonographer Comments: Global longitudinal strain was attempted. IMPRESSIONS  1. Left ventricular ejection fraction, by estimation, is 30 to 35%. Left ventricular ejection fraction by 3D volume is 28 %. Left ventricular ejection fraction by PLAX is 32 %. The left ventricle has moderately decreased function. The left ventricle demonstrates global hypokinesis. Left ventricular diastolic parameters are consistent with Grade I diastolic dysfunction (impaired relaxation). The average left ventricular global longitudinal strain is -4.5 %. The global longitudinal strain is abnormal.  2. Right ventricular systolic function is normal. The right ventricular size is normal. There is normal pulmonary artery systolic pressure. The estimated right ventricular systolic pressure is 16.8  mmHg.  3. The mitral valve is normal in structure. No evidence of mitral valve regurgitation. No evidence of mitral stenosis.  4. The aortic valve is normal in structure. Aortic valve regurgitation is not visualized. No aortic stenosis is present.  5. The inferior vena cava is normal in size with greater than 50% respiratory variability, suggesting right atrial pressure of 3 mmHg. FINDINGS  Left Ventricle: Left ventricular ejection fraction, by estimation, is 30 to 35%. Left ventricular ejection fraction by PLAX is 32 %. Left ventricular ejection fraction by 3D volume is 28 %. The left ventricle has moderately decreased function. The left ventricle demonstrates global hypokinesis. The average left ventricular global longitudinal strain is -4.5 %. Strain was performed and the global longitudinal strain is abnormal. The left ventricular internal cavity size was normal in size. There is no left ventricular hypertrophy. Left ventricular diastolic parameters are consistent with Grade I diastolic dysfunction (impaired relaxation). Right Ventricle: The right ventricular size is normal. No increase in right ventricular wall thickness. Right ventricular systolic function is normal. There is normal pulmonary artery systolic pressure. The tricuspid regurgitant velocity is 1.72 m/s, and  with an assumed right atrial pressure of 5 mmHg, the estimated right ventricular systolic pressure is 16.8 mmHg. Left Atrium: Left atrial size was normal in size. Right Atrium: Right atrial size was normal in size. Pericardium: There is no evidence of pericardial effusion. Mitral Valve: The mitral valve is normal in structure. No evidence of mitral valve regurgitation. No evidence of mitral valve stenosis. Tricuspid Valve: The tricuspid valve is normal in structure. Tricuspid valve regurgitation is not demonstrated. No evidence of tricuspid stenosis. Aortic Valve: The aortic valve is normal in structure. Aortic valve regurgitation is not  visualized. No aortic stenosis is present. Aortic valve mean gradient measures 2.0 mmHg.  Aortic valve peak gradient measures 4.0 mmHg. Aortic valve area, by VTI measures 2.41 cm. Pulmonic Valve: The pulmonic valve was normal in structure. Pulmonic valve regurgitation is not visualized. No evidence of pulmonic stenosis. Aorta: The aortic root is normal in size and structure. Venous: The inferior vena cava is normal in size with greater than 50% respiratory variability, suggesting right atrial pressure of 3 mmHg. IAS/Shunts: No atrial level shunt detected by color flow Doppler. Additional Comments: 3D was performed not requiring image post processing on an independent workstation and was abnormal.  LEFT VENTRICLE PLAX 2D LV EF:         Left            Diastology                ventricular     LV e' medial:    6.42 cm/s                ejection        LV E/e' medial:  8.6                fraction by     LV e' lateral:   12.80 cm/s                PLAX is 32      LV E/e' lateral: 4.3                %. LVIDd:         5.20 cm         2D Longitudinal LVIDs:         4.40 cm         Strain LV PW:         1.50 cm         2D Strain GLS   -4.5 % LV IVS:        1.10 cm         Avg: LVOT diam:     2.30 cm LV SV:         39              3D Volume EF LV SV Index:   14              LV 3D EF:    Left LVOT Area:     4.15 cm                     ventricul                                             ar                                             ejection LV Volumes (MOD)                            fraction LV vol d, MOD    160.0 ml                   by 3D A2C:  volume is LV vol d, MOD    149.0 ml                   28 %. A4C: LV vol s, MOD    125.0 ml A4C:                           3D Volume EF: LV SV MOD A4C:   149.0 ml      3D EF:        28 % RIGHT VENTRICLE RV Basal diam:  3.70 cm RV Mid diam:    3.20 cm LEFT ATRIUM             Index        RIGHT ATRIUM           Index LA diam:        3.50 cm 1.22  cm/m   RA Area:     12.50 cm LA Vol (A2C):   41.8 ml 14.54 ml/m  RA Volume:   25.50 ml  8.87 ml/m LA Vol (A4C):   33.8 ml 11.76 ml/m LA Biplane Vol: 38.8 ml 13.50 ml/m  AORTIC VALVE AV Area (Vmax):    2.39 cm AV Area (Vmean):   2.30 cm AV Area (VTI):     2.41 cm AV Vmax:           100.00 cm/s AV Vmean:          69.300 cm/s AV VTI:            0.163 m AV Peak Grad:      4.0 mmHg AV Mean Grad:      2.0 mmHg LVOT Vmax:         57.50 cm/s LVOT Vmean:        38.300 cm/s LVOT VTI:          0.095 m LVOT/AV VTI ratio: 0.58  AORTA Ao Root diam: 3.40 cm MITRAL VALVE               TRICUSPID VALVE MV Area (PHT): 5.46 cm    TR Peak grad:   11.8 mmHg MV Decel Time: 139 msec    TR Vmax:        172.00 cm/s MV E velocity: 55.50 cm/s MV A velocity: 69.70 cm/s  SHUNTS MV E/A ratio:  0.80        Systemic VTI:  0.09 m                            Systemic Diam: 2.30 cm Julien Nordmann MD Electronically signed by Julien Nordmann MD Signature Date/Time: 03/20/2023/3:14:11 PM    Final    CT Angio Chest PE W/Cm &/Or Wo Cm Result Date: 03/19/2023 CLINICAL DATA:  Shortness of breath with right lower extremity swelling and burning. Suspected PE, high probability. EXAM: CT ANGIOGRAPHY CHEST WITH CONTRAST TECHNIQUE: Multidetector CT imaging of the chest was performed using the standard protocol during bolus administration of intravenous contrast. Multiplanar CT image reconstructions and MIPs were obtained to evaluate the vascular anatomy. RADIATION DOSE REDUCTION: This exam was performed according to the departmental dose-optimization program which includes automated exposure control, adjustment of the mA and/or kV according to patient size and/or use of iterative reconstruction technique. CONTRAST:  OMNIPAQUE IOHEXOL 350 MG/ML SOLN COMPARISON:  PA and lateral chest today, chest radiograph 07/10/2014. FINDINGS: Cardiovascular: The pulmonary trunk is prominent indicating arterial  hypertension measuring 3.5 cm. There is a large  pulmonary arterial embolic burden. There is an acute saddle embolus partially occluding both distal main bronchi, with near occlusive emboli in the right greater than left lower lobe main arteries with extension into the segmental lower lobe arteries and scattered down stream subsegmental arteries. There are segmental and subsegmental emboli in both upper and lower lobes as well, with near occlusive thrombus in the interlobar pulmonary artery and the medial and lateral segmental arteries to the right middle lobe. There is an elevated RV/LV ratio 1.15 indicating right heart strain. The heart is slightly enlarged. There is no pericardial effusion. The aorta and great vessels are normal with normal variant short-segment brachiobicarotid trunk and 2 vessel aortic arch. The pulmonary veins are not distended. Mediastinum/Nodes: No enlarged mediastinal, hilar, or axillary lymph nodes. The lower poles of the thyroid gland, trachea, and esophagus demonstrate no significant findings. Lungs/Pleura: Lungs are clear. No pleural effusion or pneumothorax. Upper Abdomen: No acute findings. Musculoskeletal: No chest wall abnormality to the extent visualized. The entire chest wall could not be included in the field. No acute or significant osseous findings. Review of the MIP images confirms the above findings. IMPRESSION: 1. Large pulmonary arterial embolic burden with saddle embolus and near occlusive emboli in the right greater than left lower lobe main arteries, with extension into the segmental and subsegmental arteries in both upper and lower lobes, main and segmental arteries in the right middle lobe. 2. Positive for acute PE with CT evidence of right heart strain (RV/LV Ratio = 1.15) consistent with at least submassive (intermediate risk) PE. The presence of right heart strain has been associated with an increased risk of morbidity and mortality. Please refer to the "Code PE Focused" order set in EPIC. 3. Prominent pulmonary  trunk. 4. Mild cardiomegaly. 5. No other acute chest CT findings. 6. Critical Value/emergent results were called by telephone at the time of interpretation on 03/19/2023 at 11:10 pm to provider [facility] , who verbally acknowledged these results. Electronically Signed   By: Almira Bar M.D.   On: 03/19/2023 23:20   US Venous Img Lower Unilateral Right Result Date: 03/19/2023 CLINICAL DATA:  Shortness of breath and right leg swelling. EXAM: RIGHT LOWER EXTREMITY VENOUS DOPPLER ULTRASOUND TECHNIQUE: Gray-scale sonography with graded compression, as well as color Doppler and duplex ultrasound were performed to evaluate the lower extremity deep venous systems from the level of the common femoral vein and including the common femoral, femoral, profunda femoral, popliteal and calf veins including the posterior tibial, peroneal and gastrocnemius veins when visible. The superficial great saphenous vein was also interrogated. Spectral Doppler was utilized to evaluate flow at rest and with distal augmentation maneuvers in the common femoral, femoral and popliteal veins. COMPARISON:  None Available. FINDINGS: Contralateral Common Femoral Vein: Respiratory phasicity is normal and symmetric with the symptomatic side. No evidence of thrombus. Normal compressibility. Common Femoral Vein: No evidence of thrombus. Normal compressibility, respiratory phasicity and response to augmentation. Saphenofemoral Junction: No evidence of thrombus. Normal compressibility and flow on color Doppler imaging. Profunda Femoral Vein: No evidence of thrombus. Normal compressibility and flow on color Doppler imaging. Femoral Vein: No evidence of thrombus. Normal compressibility, respiratory phasicity and response to augmentation. Popliteal Vein: No evidence of thrombus. Normal compressibility, respiratory phasicity and response to augmentation. Calf Veins: The RIGHT peroneal vein is limited in visualization secondary to surrounding edema. No  evidence of thrombus within the RIGHT posterior tibial vein. Normal compressibility and flow on  color Doppler imaging. Superficial Great Saphenous Vein: No evidence of thrombus. Normal compressibility. Venous Reflux:  None. Other Findings: A 1.4 cm x 0.6 cm x 2.9 cm cyst is seen within the soft tissue structures of the RIGHT popliteal fossa. Right groin lymph nodes are seen. The largest measures approximately 4.1 cm x 1.4 cm x 3.0 cm. IMPRESSION: 1. Limited evaluation of the RIGHT peroneal vein without evidence of deep venous thrombosis within the RIGHT lower extremity. 2. RIGHT Baker's cyst. Electronically Signed   By: Aram Candela M.D.   On: 03/19/2023 19:20   DG Chest 2 View Result Date: 03/19/2023 CLINICAL DATA:  COPD.  Shortness of breath. EXAM: CHEST - 2 VIEW COMPARISON:  Chest radiograph dated 07/10/2014. FINDINGS: The heart size and mediastinal contours are within normal limits. Both lungs are clear. The visualized skeletal structures are unremarkable. IMPRESSION: No active cardiopulmonary disease. Electronically Signed   By: Elgie Collard M.D.   On: 03/19/2023 17:51    ECHO as above  TELEMETRY reviewed by me 03/21/2023: Sinus rhythm rate 80s  EKG reviewed by me: Sinus tachycardia rate 103 bpm  Data reviewed by me 03/21/2023: last 24h vitals tele labs imaging I/O ED provider note, admission H&P  Principal Problem:   Acute saddle pulmonary embolism with acute cor pulmonale (HCC) Active Problems:   Morbid obesity with BMI of 50.0-59.9, adult (HCC)   Tobacco use disorder   Baker's cyst of knee, right   Acute pulmonary embolism (HCC)    ASSESSMENT AND PLAN:  Peter Sparks is a 38 y.o. male  with a past medical history of depression, morbid obesity, tobacco use who presented to the ED on 03/19/2023 for shortness of breath.  Found to have saddle PE and underwent mechanical thrombectomy.  EF found to be reduced on echo at 30-35%.  Cardiology was consulted for further evaluation.  #  Acute saddle PE # Newly reduced EF # Obesity Patient presented to the ED with complaints of 2 weeks of shortness of breath as well as lower extremity edema.  Found to have saddle PE on CTA and underwent mechanical thrombectomy 03/20/2023.  Troponins 19 > 15.  EKG with sinus tachycardia.  Echo this admission revealed EF 30-35% with global hypokinesis, no known history of LV dysfunction. -Failure to see patient, appreciate their evaluation. -Will start losartan 50 mg daily and metoprolol succinate 25 mg daily. Start spironolactone 12.5 mg daily tomorrow. Will continue to make additions to GDMT as BP and renal function allow. Of note, he does not have insurance so cost of some medications may be prohibitive. Pharmacy working on patient assistance for [employer].  -Continue Eliquis per pharmacy for PE. -Will plan to set patient up with advanced heart failure in our clinic for outpatient follow-up.  # Tobacco use -Patient educated on importance of cessation.   This patient's plan of care was discussed and created with Dr. Darrold Junker and he is in agreement.  Signed: Gale Journey, PA-C  03/21/2023, 11:41 AM [facility]

## 2023-03-21 NOTE — Progress Notes (Signed)
  Progress Note    03/21/2023 9:51 AM 1 Day Post-Op  Subjective:  Peter Sparks is a 38 yo male who is now POD #1 from a pulmonary Thrombectomy. Patient is resting comfortably on room air. Patient endorses shortness of breath on exertion, noted by ambulating to the bathroom. No complaints over night and vitals all remain stable.    Vitals:   03/21/23 0549 03/21/23 0800  BP: 139/77 (!) 144/96  Pulse: 81 85  Resp: 19   Temp: 98.3 F (36.8 C) (!) 96.9 F (36.1 C)  SpO2: 99% 100%   Physical Exam: Cardiac:  RRR, Normal S1, S2. No murmurs present Lungs:  Lungs clear throughout on auscultation. Diminished in the bases. No rales rhonchi or wheezing.  Incisions:  Right groin with dressing in place. No hematoma, seroma or infection to note.  Extremities:  All extremities are warm to touch. Palpable Pulses throughout.  Abdomen:  Positive bowel sounds throughout, Soft, non tender and non distended.  Neurologic: AAOx3, answers all questions and follows commands appropriately.   CBC    Component Value Date/Time   WBC 9.8 03/21/2023 0514   RBC 4.14 (L) 03/21/2023 0514   HGB 11.3 (L) 03/21/2023 0514   HGB 14.3 08/03/2011 1257   HCT 34.5 (L) 03/21/2023 0514   HCT 43.1 08/03/2011 1257   PLT 219 03/21/2023 0514   PLT 320 08/03/2011 1257   MCV 83.3 03/21/2023 0514   MCV 87 08/03/2011 1257   MCH 27.3 03/21/2023 0514   MCHC 32.8 03/21/2023 0514   RDW 13.6 03/21/2023 0514   RDW 13.4 08/03/2011 1257   LYMPHSABS 3.4 07/22/2019 1735   MONOABS 0.4 07/22/2019 1735   EOSABS 0.5 07/22/2019 1735   BASOSABS 0.1 07/22/2019 1735    BMET    Component Value Date/Time   NA 136 03/21/2023 0514   NA 139 08/03/2011 1257   K 3.6 03/21/2023 0514   K 3.6 08/03/2011 1257   CL 105 03/21/2023 0514   CL 104 08/03/2011 1257   CO2 22 03/21/2023 0514   CO2 27 08/03/2011 1257   GLUCOSE 124 (H) 03/21/2023 0514   GLUCOSE 118 (H) 08/03/2011 1257   BUN 15 03/21/2023 0514   BUN 13 08/03/2011 1257    CREATININE 1.08 03/21/2023 0514   CREATININE 1.20 08/03/2011 1257   CALCIUM 8.5 (L) 03/21/2023 0514   CALCIUM 9.1 08/03/2011 1257   GFRNONAA >60 03/21/2023 0514   GFRNONAA >60 08/03/2011 1257   GFRAA >60 07/22/2019 1735   GFRAA >60 08/03/2011 1257    INR    Component Value Date/Time   INR 1.1 03/19/2023 2347     Intake/Output Summary (Last 24 hours) at 03/21/2023 0951 Last data filed at 03/21/2023 0346 Gross per 24 hour  Intake 890.96 ml  Output 1 ml  Net 889.96 ml     Assessment/Plan:  38 y.o. male is s/p Pulmonary Thrombectomy 1 Day Post-Op   PLAN Discontinue Heparin Infusion today. Start Oral anticoagulation. Recommend Eliquis 10 mg BID X 7 Day then decrease dose to 5 mg BID indefinitely.  Patient to follow up with [facility] as scheduled.  Okay per Vascular Surgery for patient to be discharged today.   DVT prophylaxis:  Heparin Infusion    Marcie Bal [facility] 03/21/2023 9:51 AM

## 2023-03-21 NOTE — Plan of Care (Signed)
  Problem: Clinical Measurements: Goal: Respiratory complications will improve Outcome: Progressing Goal: Cardiovascular complication will be avoided Outcome: Progressing   Problem: Activity: Goal: Risk for activity intolerance will decrease Outcome: Progressing   Problem: Nutrition: Goal: Adequate nutrition will be maintained Outcome: Progressing   Problem: Coping: Goal: Level of anxiety will decrease Outcome: Progressing   Problem: Elimination: Goal: Will not experience complications related to bowel motility Outcome: Progressing Goal: Will not experience complications related to urinary retention Outcome: Progressing   Problem: Safety: Goal: Ability to remain free from injury will improve Outcome: Progressing   Problem: Pain Managment: Goal: General experience of comfort will improve and/or be controlled Outcome: Not Progressing

## 2023-03-21 NOTE — Plan of Care (Signed)

## 2023-03-21 NOTE — Progress Notes (Signed)
 ANTICOAGULATION CONSULT NOTE  Pharmacy Consult to transition heparin gtt to Eliquis Indication: pulmonary embolus  No Known Allergies  Patient Measurements: Height: 6\' 1"  (185.4 cm) Weight: (!) 179.4 kg (395 lb 6.4 oz) IBW/kg (Calculated) : 79.9 Heparin Dosing Weight: 124.3 kg  Vital Signs: Temp: 96.9 F (36.1 C) (03/20 0800) BP: 144/96 (03/20 0800) Pulse Rate: 85 (03/20 0800)  Assessment: Pt is a 38 yo male presenting to ED for "SOB and right leg swelling/burning since Saturday," found with "large pulmonary arterial embolic burden with saddle embolus and near occlusive emboli in the right greater than left lower lobe main arteries, with extension into the segmental and subsegmental arteries in both upper and lower lobes, main and segmental arteries in the right middle lobe."   Plan:  DC heparin infusion, heparin consult and next heparin lab. Start Eliquis 10mg  po BID x 1 week, then 5mg  po BID.  Jasmon Graffam Rodriguez-Guzman PharmD, BCPS 03/21/2023 11:29 AM

## 2023-03-21 NOTE — Progress Notes (Signed)
 Heart Failure Stewardship Pharmacy Note  PCP: Patient, No Pcp Per PCP-Cardiologist: None  HPI: Peter Sparks is a 38 y.o. male with  Tobacco use disorder, morbid obesity and history of depression who presented with a 2 week history of shortness of breath. CTA PE 03/19/23 showed large saddle PE near occlusive R>L lowe lobe with extension to segmental & subsegmental arteries in the right middle lobe. Right heart strain evident with RV/LV ratio of 1.15. Underwent pulmonary thrombectomy on 03/20/23. On admission, BNP was not measured, HS-troponin was 19 >15, AT3 was 120, and other hypercoagulable work-up is pending. Chest x-ray noted no active cardiopulmonary disease. Doppler negative for DVT. LVEF on 03/20/23 was 30-35% with grade I diastolic dysfunction with normal RV function.  Pertinent Lab Values: Creatinine  Date Value Ref Range Status  08/03/2011 1.20 0.60 - 1.30 mg/dL Final   Creatinine, Ser  Date Value Ref Range Status  03/21/2023 1.08 0.61 - 1.24 mg/dL Final   BUN  Date Value Ref Range Status  03/21/2023 15 6 - 20 mg/dL Final  16/10/9602 13 7 - 18 mg/dL Final   Potassium  Date Value Ref Range Status  03/21/2023 3.6 3.5 - 5.1 mmol/L Final  08/03/2011 3.6 3.5 - 5.1 mmol/L Final   Sodium  Date Value Ref Range Status  03/21/2023 136 135 - 145 mmol/L Final  08/03/2011 139 136 - 145 mmol/L Final    Vital Signs: Admission weight: Temp:  [96.9 F (36.1 C)-99.2 F (37.3 C)] 96.9 F (36.1 C) (03/20 0800) Pulse Rate:  [0-107] 85 (03/20 0800) Cardiac Rhythm: Normal sinus rhythm (03/20 0800) Resp:  [10-25] 19 (03/20 0549) BP: (123-177)/(77-118) 144/96 (03/20 0800) SpO2:  [93 %-100 %] 100 % (03/20 0800)  Intake/Output Summary (Last 24 hours) at 03/21/2023 0857 Last data filed at 03/21/2023 0346 Gross per 24 hour  Intake 890.96 ml  Output 1 ml  Net 889.96 ml    Current Heart Failure Medications:  Loop diuretic: none Beta-Blocker: none ACEI/ARB/ARNI: none MRA:  none SGLT2i: none Other: none  Prior to admission Heart Failure Medications:  none  Assessment: 1. 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 NYHA class II-III symptoms.  -Symptoms: Patient reports improvement in shortness of breath since thrombectomy. Still experiences mild shortness of breath. Endorses dyspnea on exertion for ~2 weeks before presentation along with swelling and tightness in his bilateral lower extremities.  -Volume: Appears volume up. JVP is elevated and still has some LEE, though appears reduced based on wrinkling of skin. Creatinine is normal. It is likely that initiating GDMT to control hypertension could provide enough maintenance diuresis. Could consider a dose of IV furosemide today to evaluate response. -Hemodynamics: BP is persistently elevated with HR in 80s. -BB: Can consider adding down the road. Would focus on other agents at this time. -ACEI/ARB/ARNI: Consider starting losartan today for elevated BP. Can work on patient assistance for [employer] if desired. -MRA: Consider starting spironolactone 25 mg daily. This will help maintain potassium and increase diuresis without need for loops. -SGLT2i:Consider adding SGLT2i if A1c is <10. Can use 30 day free card at discharge and apply for patient assistance.   Plan: 1) Medication changes recommended at this time: -Consider spironolactone 25 mg daily -Consider adding losartan 25 mg daily -Consider Jardiance 10 mg daily prior to discharge if A1c is controlled.  2) Patient assistance: -Discussed  with [name], who will assist the patient in applyign for Medicaid. -Would be likely to qualify for patient assistance.  3) Education: - Patient has been educated on current HF medications and potential additions to HF medication regimen - Patient  verbalizes understanding that over the next few months, these medication doses may change and more medications may be added to optimize HF regimen - Patient has been educated on basic disease state pathophysiology and goals of therapy  Medication Assistance / Insurance Benefits Check: Does the patient have prescription insurance?    Type of insurance plan:  Does the patient qualify for medication assistance through manufacturers or grants? Pending  Eligible grants and/or patient assistance programs: pending  Medication assistance applications in progress: pending  Medication assistance applications approved: pending Approved medication assistance renewals will be completed by: [name], Medication Management at [facility]  Outpatient Pharmacy: Prior to admission outpatient pharmacy: none      Please do not hesitate to reach out with questions or concerns,  Enos Fling, PharmD, CPP, BCPS Heart Failure Pharmacist  Phone - [phone] 03/21/2023 12:08 PM

## 2023-03-22 ENCOUNTER — Other Ambulatory Visit: Payer: Self-pay

## 2023-03-22 DIAGNOSIS — I429 Cardiomyopathy, unspecified: Secondary | ICD-10-CM | POA: Diagnosis not present

## 2023-03-22 LAB — CBC
HCT: 37 % — ABNORMAL LOW (ref 39.0–52.0)
Hemoglobin: 11.9 g/dL — ABNORMAL LOW (ref 13.0–17.0)
MCH: 27.6 pg (ref 26.0–34.0)
MCHC: 32.2 g/dL (ref 30.0–36.0)
MCV: 85.8 fL (ref 80.0–100.0)
Platelets: 228 10*3/uL (ref 150–400)
RBC: 4.31 MIL/uL (ref 4.22–5.81)
RDW: 13.4 % (ref 11.5–15.5)
WBC: 9.2 10*3/uL (ref 4.0–10.5)
nRBC: 0 % (ref 0.0–0.2)

## 2023-03-22 LAB — LUPUS ANTICOAGULANT PANEL
DRVVT: 33.7 s (ref 0.0–47.0)
PTT Lupus Anticoagulant: 44.2 s — ABNORMAL HIGH (ref 0.0–43.5)

## 2023-03-22 LAB — BASIC METABOLIC PANEL
Anion gap: 8 (ref 5–15)
BUN: 15 mg/dL (ref 6–20)
CO2: 23 mmol/L (ref 22–32)
Calcium: 8.8 mg/dL — ABNORMAL LOW (ref 8.9–10.3)
Chloride: 106 mmol/L (ref 98–111)
Creatinine, Ser: 1 mg/dL (ref 0.61–1.24)
GFR, Estimated: >60 mL/min (ref >60)
Glucose, Bld: 99 mg/dL (ref 70–99)
Potassium: 3.9 mmol/L (ref 3.5–5.1)
Sodium: 137 mmol/L (ref 135–145)

## 2023-03-22 LAB — PROTEIN S, TOTAL: Protein S Ag, Total: 109 % (ref 60–150)

## 2023-03-22 LAB — PROTEIN S ACTIVITY: Protein S Activity: 59 % — ABNORMAL LOW (ref 63–140)

## 2023-03-22 LAB — HEXAGONAL PHASE PHOSPHOLIPID: Hexagonal Phase Phospholipid: 3 s (ref 0–11)

## 2023-03-22 LAB — PTT-LA MIX: PTT-LA Mix: 41.8 s — ABNORMAL HIGH (ref 0.0–40.5)

## 2023-03-22 LAB — PROTEIN C, TOTAL: Protein C, Total: 114 % (ref 60–150)

## 2023-03-22 LAB — PROTEIN C ACTIVITY: Protein C Activity: 131 % (ref 73–180)

## 2023-03-22 MED ORDER — LOSARTAN POTASSIUM 50 MG PO TABS
50.0000 mg | ORAL_TABLET | Freq: Every day | ORAL | 1 refills | Status: DC
  Filled 2023-03-22: qty 90, 90d supply, fill #0

## 2023-03-22 MED ORDER — APIXABAN 5 MG PO TABS
ORAL_TABLET | ORAL | 0 refills | Status: DC
  Filled 2023-03-22: qty 74, 30d supply, fill #0

## 2023-03-22 MED ORDER — METOPROLOL SUCCINATE ER 25 MG PO TB24
25.0000 mg | ORAL_TABLET | Freq: Every day | ORAL | 1 refills | Status: AC
  Filled 2023-03-22: qty 90, 90d supply, fill #0

## 2023-03-22 MED ORDER — SPIRONOLACTONE 25 MG PO TABS
12.5000 mg | ORAL_TABLET | Freq: Every day | ORAL | 1 refills | Status: AC
  Filled 2023-03-22: qty 45, 90d supply, fill #0

## 2023-03-22 NOTE — Plan of Care (Signed)
  Problem: Education: Goal: Knowledge of General Education information will improve Description: Including pain rating scale, medication(s)/side effects and non-pharmacologic comfort measures Outcome: Progressing   Problem: Health Behavior/Discharge Planning: Goal: Ability to manage health-related needs will improve Outcome: Progressing   Problem: Clinical Measurements: Goal: Respiratory complications will improve Outcome: Progressing   Problem: Clinical Measurements: Goal: Cardiovascular complication will be avoided Outcome: Progressing   Problem: Activity: Goal: Risk for activity intolerance will decrease Outcome: Progressing   Problem: Nutrition: Goal: Adequate nutrition will be maintained Outcome: Progressing   Problem: Pain Managment: Goal: General experience of comfort will improve and/or be controlled Outcome: Progressing

## 2023-03-22 NOTE — TOC CM/SW Note (Signed)
 Patient is listed as uninsured. Met with patient who is DC today. He is interested in using [employer]. Provided map as well as information for [facility] for PCP services. [facility] to deliver beds to bedside before DC today. Cousin to transport home.  Alfonso Ramus, LCSW Transitions of Care Department [phone]

## 2023-03-22 NOTE — Plan of Care (Signed)

## 2023-03-22 NOTE — Discharge Summary (Signed)
 Physician Discharge Summary   Patient: Peter Sparks MRN: [id number] DOB: 05-Dec-1985  Admit date:     03/19/2023  Discharge date: 03/22/23  Discharge Physician: Marcelino Duster   PCP: Patient, No Pcp Per   Recommendations at discharge:    PCP follow up in 1 week. [facility] Cardiology follow-up as scheduled. Vascular follow-up as scheduled.  Discharge Diagnoses: Principal Problem:   Acute saddle pulmonary embolism with acute cor pulmonale (HCC) Active Problems:   Morbid obesity with BMI of 50.0-59.9, adult (HCC)   Tobacco use disorder   Baker's cyst of knee, right   Acute pulmonary embolism (HCC)  Resolved Problems:   * No resolved hospital problems. [facility] Course: Peter Sparks is a 38 y.o. male with medical history significant for Tobacco use disorder, morbid obesity and history of depression who presents to the ED with a 2-week history of shortness of breath.    CTA PE protocol showed saddle embolus with large pulmonary artery embolic burden, RV strain.  Right lower extremity ultrasound negative for DVT showed Baker's cyst.  Patient is started on heparin infusion, vascular surgery consulted.   Assessment and Plan: * Acute saddle pulmonary embolism with acute cor pulmonale (HCC) New reduced EF CTA PE protocol saddle embolus with large pulmonary arterial embolic burden, RV strain with RV LV ratio 1.15 Vascular surgery performed thrombectomy 03/20/23. He tolerated the procedure well. Heparin drip transition to Eliquis therapy. Echocardiogram revealed reduced EF at 30-35% with global hypokinesis.  Cardiologist evaluated the patient recommended metoprolol, Cozaar, spironolactone therapy and advised outpatient follow-up. Patient is provided with insurance assistance, medication assistance.   Baker's cyst of knee, right Patient had right leg pain.  Baker's cyst on ultrasound, no DVT.   Tobacco use disorder Nicotine patch  Counseled regarding smoking cessation    Morbid obesity with BMI of 50.0-59.9, adult (HCC) Complicating factor to overall prognosis Diet, exercise and weight reduction along with lifestyle modifications advised.       Consultants: Cardiology, vascular surgery Procedures performed: Thrombectomy 03/20/23 Disposition: Home Diet recommendation:  Discharge Diet Orders (From admission, onward)     Start     Ordered   03/22/23 0000  Diet - low sodium heart healthy        03/22/23 1048           Cardiac diet DISCHARGE MEDICATION: Allergies as of 03/22/2023   No Known Allergies      Medication List     STOP taking these medications    ketorolac 10 MG tablet Commonly known as: TORADOL   ondansetron 4 MG disintegrating tablet Commonly known as: Zofran ODT       TAKE these medications    apixaban 5 MG Tabs tablet Commonly known as: ELIQUIS Take 2 tablets (10 mg total) by mouth 2 (two) times daily for 6 days, THEN 1 tablet (5 mg total) 2 (two) times daily. Start taking on: March 22, 2023   losartan 50 MG tablet Commonly known as: COZAAR Take 1 tablet (50 mg total) by mouth daily. Start taking on: March 23, 2023   metoprolol succinate 25 MG 24 hr tablet Commonly known as: TOPROL-XL Take 1 tablet (25 mg total) by mouth daily. Start taking on: March 23, 2023   spironolactone 25 MG tablet Commonly known as: ALDACTONE Take 0.5 tablets (12.5 mg total) by mouth daily. Start taking on: March 23, 2023        Follow-up Information     Paraschos, Lyn Hollingshead, MD. Schedule an appointment as soon  as possible for a visit in 1 week(s).   Specialty: Cardiology Contact information: [address] [facility] [place] [address] [phone]                Discharge Exam: Ceasar Mons Weights   03/19/23 1607 03/19/23 2244  Weight: (!) 181.4 kg (!) 179.4 kg      03/22/2023    7:30 AM 03/22/2023    3:23 AM 03/21/2023    7:44 PM  Vitals with BMI  Systolic 131 119 401  Diastolic  88 69 78  Pulse 79 91 85   General -Young African-American morbidly obese male, no apparent distress HEENT - PERRLA, EOMI, atraumatic head, non tender sinuses. Lung -distant, basal rales, diffuse rhonchi, no wheezes. Heart - S1, S2 heard, no murmurs, rubs, trace pedal edema. Abdomen - Soft, non tender, obese, bowel sounds good Neuro - Alert, awake and oriented x 3, non focal exam. Skin - Warm and dry.  Condition at discharge: stable  The results of significant diagnostics from this hospitalization (including imaging, microbiology, ancillary and laboratory) are listed below for reference.   Imaging Studies: PERIPHERAL VASCULAR CATHETERIZATION Result Date: 03/20/2023 See surgical note for result.  ECHOCARDIOGRAM COMPLETE Result Date: 03/20/2023    ECHOCARDIOGRAM REPORT   Patient Name:   Peter Sparks Date of Exam: 03/20/2023 Medical Rec #:  [id number]       Height:       73.0 in Accession #:    [id number]      Weight:       395.4 lb Date of Birth:  1985-01-08      BSA:          2.875 m Patient Age:    37 years        BP:           118/83 mmHg Patient Gender: M               HR:           87 bpm. Exam Location:  [facility] Procedure: 2D Echo, Cardiac Doppler, Color Doppler and Strain Analysis (Both            Spectral and Color Flow Doppler were utilized during procedure). Indications:     Pulmonary embolus I26.09  History:         Patient has no prior history of Echocardiogram examinations. No                  past medical history on file.  Sonographer:     Cristela Blue Referring Phys:  [id number] Andris Baumann Diagnosing Phys: Julien Nordmann MD  Sonographer Comments: Global longitudinal strain was attempted. IMPRESSIONS  1. Left ventricular ejection fraction, by estimation, is 30 to 35%. Left ventricular ejection fraction by 3D volume is 28 %. Left ventricular ejection fraction by PLAX is 32 %. The left ventricle has moderately decreased function. The left ventricle demonstrates global hypokinesis. Left  ventricular diastolic parameters are consistent with Grade I diastolic dysfunction (impaired relaxation). The average left ventricular global longitudinal strain is -4.5 %. The global longitudinal strain is abnormal.  2. Right ventricular systolic function is normal. The right ventricular size is normal. There is normal pulmonary artery systolic pressure. The estimated right ventricular systolic pressure is 16.8 mmHg.  3. The mitral valve is normal in structure. No evidence of mitral valve regurgitation. No evidence of mitral stenosis.  4. The aortic valve is normal in structure. Aortic valve regurgitation is not visualized.  No aortic stenosis is present.  5. The inferior vena cava is normal in size with greater than 50% respiratory variability, suggesting right atrial pressure of 3 mmHg. FINDINGS  Left Ventricle: Left ventricular ejection fraction, by estimation, is 30 to 35%. Left ventricular ejection fraction by PLAX is 32 %. Left ventricular ejection fraction by 3D volume is 28 %. The left ventricle has moderately decreased function. The left ventricle demonstrates global hypokinesis. The average left ventricular global longitudinal strain is -4.5 %. Strain was performed and the global longitudinal strain is abnormal. The left ventricular internal cavity size was normal in size. There is no left ventricular hypertrophy. Left ventricular diastolic parameters are consistent with Grade I diastolic dysfunction (impaired relaxation). Right Ventricle: The right ventricular size is normal. No increase in right ventricular wall thickness. Right ventricular systolic function is normal. There is normal pulmonary artery systolic pressure. The tricuspid regurgitant velocity is 1.72 m/s, and  with an assumed right atrial pressure of 5 mmHg, the estimated right ventricular systolic pressure is 16.8 mmHg. Left Atrium: Left atrial size was normal in size. Right Atrium: Right atrial size was normal in size. Pericardium: There is  no evidence of pericardial effusion. Mitral Valve: The mitral valve is normal in structure. No evidence of mitral valve regurgitation. No evidence of mitral valve stenosis. Tricuspid Valve: The tricuspid valve is normal in structure. Tricuspid valve regurgitation is not demonstrated. No evidence of tricuspid stenosis. Aortic Valve: The aortic valve is normal in structure. Aortic valve regurgitation is not visualized. No aortic stenosis is present. Aortic valve mean gradient measures 2.0 mmHg. Aortic valve peak gradient measures 4.0 mmHg. Aortic valve area, by VTI measures 2.41 cm. Pulmonic Valve: The pulmonic valve was normal in structure. Pulmonic valve regurgitation is not visualized. No evidence of pulmonic stenosis. Aorta: The aortic root is normal in size and structure. Venous: The inferior vena cava is normal in size with greater than 50% respiratory variability, suggesting right atrial pressure of 3 mmHg. IAS/Shunts: No atrial level shunt detected by color flow Doppler. Additional Comments: 3D was performed not requiring image post processing on an independent workstation and was abnormal.  LEFT VENTRICLE PLAX 2D LV EF:         Left            Diastology                ventricular     LV e' medial:    6.42 cm/s                ejection        LV E/e' medial:  8.6                fraction by     LV e' lateral:   12.80 cm/s                PLAX is 32      LV E/e' lateral: 4.3                %. LVIDd:         5.20 cm         2D Longitudinal LVIDs:         4.40 cm         Strain LV PW:         1.50 cm         2D Strain GLS   -4.5 % LV IVS:        1.10  cm         Avg: LVOT diam:     2.30 cm LV SV:         39              3D Volume EF LV SV Index:   14              LV 3D EF:    Left LVOT Area:     4.15 cm                     ventricul                                             ar                                             ejection LV Volumes (MOD)                            fraction LV vol d, MOD    160.0 ml                    by 3D A2C:                                        volume is LV vol d, MOD    149.0 ml                   28 %. A4C: LV vol s, MOD    125.0 ml A4C:                           3D Volume EF: LV SV MOD A4C:   149.0 ml      3D EF:        28 % RIGHT VENTRICLE RV Basal diam:  3.70 cm RV Mid diam:    3.20 cm LEFT ATRIUM             Index        RIGHT ATRIUM           Index LA diam:        3.50 cm 1.22 cm/m   RA Area:     12.50 cm LA Vol (A2C):   41.8 ml 14.54 ml/m  RA Volume:   25.50 ml  8.87 ml/m LA Vol (A4C):   33.8 ml 11.76 ml/m LA Biplane Vol: 38.8 ml 13.50 ml/m  AORTIC VALVE AV Area (Vmax):    2.39 cm AV Area (Vmean):   2.30 cm AV Area (VTI):     2.41 cm AV Vmax:           100.00 cm/s AV Vmean:          69.300 cm/s AV VTI:            0.163 m AV Peak Grad:      4.0 mmHg AV Mean Grad:      2.0 mmHg LVOT Vmax:         57.50 cm/s LVOT Vmean:  38.300 cm/s LVOT VTI:          0.095 m LVOT/AV VTI ratio: 0.58  AORTA Ao Root diam: 3.40 cm MITRAL VALVE               TRICUSPID VALVE MV Area (PHT): 5.46 cm    TR Peak grad:   11.8 mmHg MV Decel Time: 139 msec    TR Vmax:        172.00 cm/s MV E velocity: 55.50 cm/s MV A velocity: 69.70 cm/s  SHUNTS MV E/A ratio:  0.80        Systemic VTI:  0.09 m                            Systemic Diam: 2.30 cm Julien Nordmann MD Electronically signed by Julien Nordmann MD Signature Date/Time: 03/20/2023/3:14:11 PM    Final    CT Angio Chest PE W/Cm &/Or Wo Cm Result Date: 03/19/2023 CLINICAL DATA:  Shortness of breath with right lower extremity swelling and burning. Suspected PE, high probability. EXAM: CT ANGIOGRAPHY CHEST WITH CONTRAST TECHNIQUE: Multidetector CT imaging of the chest was performed using the standard protocol during bolus administration of intravenous contrast. Multiplanar CT image reconstructions and MIPs were obtained to evaluate the vascular anatomy. RADIATION DOSE REDUCTION: This exam was performed according to the departmental dose-optimization  program which includes automated exposure control, adjustment of the mA and/or kV according to patient size and/or use of iterative reconstruction technique. CONTRAST:  OMNIPAQUE IOHEXOL 350 MG/ML SOLN COMPARISON:  PA and lateral chest today, chest radiograph 07/10/2014. FINDINGS: Cardiovascular: The pulmonary trunk is prominent indicating arterial hypertension measuring 3.5 cm. There is a large pulmonary arterial embolic burden. There is an acute saddle embolus partially occluding both distal main bronchi, with near occlusive emboli in the right greater than left lower lobe main arteries with extension into the segmental lower lobe arteries and scattered down stream subsegmental arteries. There are segmental and subsegmental emboli in both upper and lower lobes as well, with near occlusive thrombus in the interlobar pulmonary artery and the medial and lateral segmental arteries to the right middle lobe. There is an elevated RV/LV ratio 1.15 indicating right heart strain. The heart is slightly enlarged. There is no pericardial effusion. The aorta and great vessels are normal with normal variant short-segment brachiobicarotid trunk and 2 vessel aortic arch. The pulmonary veins are not distended. Mediastinum/Nodes: No enlarged mediastinal, hilar, or axillary lymph nodes. The lower poles of the thyroid gland, trachea, and esophagus demonstrate no significant findings. Lungs/Pleura: Lungs are clear. No pleural effusion or pneumothorax. Upper Abdomen: No acute findings. Musculoskeletal: No chest wall abnormality to the extent visualized. The entire chest wall could not be included in the field. No acute or significant osseous findings. Review of the MIP images confirms the above findings. IMPRESSION: 1. Large pulmonary arterial embolic burden with saddle embolus and near occlusive emboli in the right greater than left lower lobe main arteries, with extension into the segmental and subsegmental arteries in both  upper and lower lobes, main and segmental arteries in the right middle lobe. 2. Positive for acute PE with CT evidence of right heart strain (RV/LV Ratio = 1.15) consistent with at least submassive (intermediate risk) PE. The presence of right heart strain has been associated with an increased risk of morbidity and mortality. Please refer to the "Code PE Focused" order set in EPIC. 3. Prominent pulmonary trunk. 4. Mild cardiomegaly. 5. No other  acute chest CT findings. 6. Critical Value/emergent results were called by telephone at the time of interpretation on 03/19/2023 at 11:10 pm to provider [facility] , who verbally acknowledged these results. Electronically Signed   By: Almira Bar M.D.   On: 03/19/2023 23:20   US Venous Img Lower Unilateral Right Result Date: 03/19/2023 CLINICAL DATA:  Shortness of breath and right leg swelling. EXAM: RIGHT LOWER EXTREMITY VENOUS DOPPLER ULTRASOUND TECHNIQUE: Gray-scale sonography with graded compression, as well as color Doppler and duplex ultrasound were performed to evaluate the lower extremity deep venous systems from the level of the common femoral vein and including the common femoral, femoral, profunda femoral, popliteal and calf veins including the posterior tibial, peroneal and gastrocnemius veins when visible. The superficial great saphenous vein was also interrogated. Spectral Doppler was utilized to evaluate flow at rest and with distal augmentation maneuvers in the common femoral, femoral and popliteal veins. COMPARISON:  None Available. FINDINGS: Contralateral Common Femoral Vein: Respiratory phasicity is normal and symmetric with the symptomatic side. No evidence of thrombus. Normal compressibility. Common Femoral Vein: No evidence of thrombus. Normal compressibility, respiratory phasicity and response to augmentation. Saphenofemoral Junction: No evidence of thrombus. Normal compressibility and flow on color Doppler imaging. Profunda Femoral Vein: No  evidence of thrombus. Normal compressibility and flow on color Doppler imaging. Femoral Vein: No evidence of thrombus. Normal compressibility, respiratory phasicity and response to augmentation. Popliteal Vein: No evidence of thrombus. Normal compressibility, respiratory phasicity and response to augmentation. Calf Veins: The RIGHT peroneal vein is limited in visualization secondary to surrounding edema. No evidence of thrombus within the RIGHT posterior tibial vein. Normal compressibility and flow on color Doppler imaging. Superficial Great Saphenous Vein: No evidence of thrombus. Normal compressibility. Venous Reflux:  None. Other Findings: A 1.4 cm x 0.6 cm x 2.9 cm cyst is seen within the soft tissue structures of the RIGHT popliteal fossa. Right groin lymph nodes are seen. The largest measures approximately 4.1 cm x 1.4 cm x 3.0 cm. IMPRESSION: 1. Limited evaluation of the RIGHT peroneal vein without evidence of deep venous thrombosis within the RIGHT lower extremity. 2. RIGHT Baker's cyst. Electronically Signed   By: Aram Candela M.D.   On: 03/19/2023 19:20   DG Chest 2 View Result Date: 03/19/2023 CLINICAL DATA:  COPD.  Shortness of breath. EXAM: CHEST - 2 VIEW COMPARISON:  Chest radiograph dated 07/10/2014. FINDINGS: The heart size and mediastinal contours are within normal limits. Both lungs are clear. The visualized skeletal structures are unremarkable. IMPRESSION: No active cardiopulmonary disease. Electronically Signed   By: Elgie Collard M.D.   On: 03/19/2023 17:51    Microbiology: Results for orders placed or performed during the hospital encounter of 07/11/19  Chlamydia/NGC rt PCR ([facility] only)     Status: None   Collection Time: 07/11/19  2:46 PM   Specimen: Urine  Result Value Ref Range Status   Specimen source GC/Chlam URINE, RANDOM  Final   Chlamydia Tr NOT DETECTED NOT DETECTED Final   N gonorrhoeae NOT DETECTED NOT DETECTED Final    Comment: (NOTE) This CT/NG assay has not  been evaluated in patients with a history of  hysterectomy. Performed at [facility], [address] [place], [place] [address]     Labs: CBC: Recent Labs  Lab 03/19/23 1610 03/21/23 0514 03/22/23 0531  WBC 12.0* 9.8 9.2  HGB 13.0 11.3* 11.9*  HCT 40.7 34.5* 37.0*  MCV 85.3 83.3 85.8  PLT 221 219 228   Basic  Metabolic Panel: Recent Labs  Lab 03/19/23 1610 03/21/23 0514 03/22/23 0531  NA 137 136 137  K 3.6 3.6 3.9  CL 105 105 106  CO2 20* 22 23  GLUCOSE 101* 124* 99  BUN 17 15 15   CREATININE 1.12 1.08 1.00  CALCIUM 9.1 8.5* 8.8*   Liver Function Tests: Recent Labs  Lab 03/19/23 1610  AST 16  ALT 17  ALKPHOS 59  BILITOT 0.7  PROT 8.3*  ALBUMIN 3.4*   CBG: No results for input(s): "GLUCAP" in the last 168 hours.  Discharge time spent: 34 minutes.  Signed: Marcelino Duster, MD [facility] 03/22/2023

## 2023-03-22 NOTE — Progress Notes (Signed)
 [facility] Cardiology  SUBJECTIVE: Patient laying in bed, denies chest pain or shortness of breath   Vitals:   03/21/23 1516 03/21/23 1944 03/22/23 0323 03/22/23 0730  BP: 138/77 130/78 119/69 131/88  Pulse: 83 85 91 79  Resp:  18 18   Temp: 98.2 F (36.8 C) 98.6 F (37 C) 98.4 F (36.9 C) 97.9 F (36.6 C)  TempSrc:  Oral    SpO2: 100% 97% 94% 100%  Weight:      Height:         Intake/Output Summary (Last 24 hours) at 03/22/2023 6387 Last data filed at 03/22/2023 0140 Gross per 24 hour  Intake 476 ml  Output --  Net 476 ml      PHYSICAL EXAM  General: Well developed, well nourished, in no acute distress HEENT:  Normocephalic and atramatic Neck:  No JVD.  Lungs: Clear bilaterally to auscultation and percussion. Heart: HRRR . Normal S1 and S2 without gallops or murmurs.  Abdomen: Bowel sounds are positive, abdomen soft and non-tender  Msk:  Back normal, normal gait. Normal strength and tone for age. Extremities: No clubbing, cyanosis or edema.   Neuro: Alert and oriented X 3. Psych:  Good affect, responds appropriately   LABS: Basic Metabolic Panel: Recent Labs    03/21/23 0514 03/22/23 0531  NA 136 137  K 3.6 3.9  CL 105 106  CO2 22 23  GLUCOSE 124* 99  BUN 15 15  CREATININE 1.08 1.00  CALCIUM 8.5* 8.8*   Liver Function Tests: Recent Labs    03/19/23 1610  AST 16  ALT 17  ALKPHOS 59  BILITOT 0.7  PROT 8.3*  ALBUMIN 3.4*   No results for input(s): "LIPASE", "AMYLASE" in the last 72 hours. CBC: Recent Labs    03/21/23 0514 03/22/23 0531  WBC 9.8 9.2  HGB 11.3* 11.9*  HCT 34.5* 37.0*  MCV 83.3 85.8  PLT 219 228   Cardiac Enzymes: No results for input(s): "CKTOTAL", "CKMB", "CKMBINDEX", "TROPONINI" in the last 72 hours. BNP: Invalid input(s): "POCBNP" D-Dimer: No results for input(s): "DDIMER" in the last 72 hours. Hemoglobin A1C: Recent Labs    03/21/23 0514  HGBA1C 4.8   Fasting Lipid Panel: No results for input(s): "CHOL", "HDL",  "LDLCALC", "TRIG", "CHOLHDL", "LDLDIRECT" in the last 72 hours. Thyroid Function Tests: No results for input(s): "TSH", "T4TOTAL", "T3FREE", "THYROIDAB" in the last 72 hours.  Invalid input(s): "FREET3" Anemia Panel: No results for input(s): "VITAMINB12", "FOLATE", "FERRITIN", "TIBC", "IRON", "RETICCTPCT" in the last 72 hours.  PERIPHERAL VASCULAR CATHETERIZATION Result Date: 03/20/2023 See surgical note for result.  ECHOCARDIOGRAM COMPLETE Result Date: 03/20/2023    ECHOCARDIOGRAM REPORT   Patient Name:   Peter Sparks Date of Exam: 03/20/2023 Medical Rec #:  [id number]       Height:       73.0 in Accession #:    [id number]      Weight:       395.4 lb Date of Birth:  September 13, 1985      BSA:          2.875 m Patient Age:    37 years        BP:           118/83 mmHg Patient Gender: M               HR:           87 bpm. Exam Location:  [facility] Procedure: 2D Echo, Cardiac Doppler, Color Doppler and Strain Analysis (  Both            Spectral and Color Flow Doppler were utilized during procedure). Indications:     Pulmonary embolus I26.09  History:         Patient has no prior history of Echocardiogram examinations. No                  past medical history on file.  Sonographer:     Cristela Blue Referring Phys:  [id number] Andris Baumann Diagnosing Phys: Julien Nordmann MD  Sonographer Comments: Global longitudinal strain was attempted. IMPRESSIONS  1. Left ventricular ejection fraction, by estimation, is 30 to 35%. Left ventricular ejection fraction by 3D volume is 28 %. Left ventricular ejection fraction by PLAX is 32 %. The left ventricle has moderately decreased function. The left ventricle demonstrates global hypokinesis. Left ventricular diastolic parameters are consistent with Grade I diastolic dysfunction (impaired relaxation). The average left ventricular global longitudinal strain is -4.5 %. The global longitudinal strain is abnormal.  2. Right ventricular systolic function is normal. The right ventricular size  is normal. There is normal pulmonary artery systolic pressure. The estimated right ventricular systolic pressure is 16.8 mmHg.  3. The mitral valve is normal in structure. No evidence of mitral valve regurgitation. No evidence of mitral stenosis.  4. The aortic valve is normal in structure. Aortic valve regurgitation is not visualized. No aortic stenosis is present.  5. The inferior vena cava is normal in size with greater than 50% respiratory variability, suggesting right atrial pressure of 3 mmHg. FINDINGS  Left Ventricle: Left ventricular ejection fraction, by estimation, is 30 to 35%. Left ventricular ejection fraction by PLAX is 32 %. Left ventricular ejection fraction by 3D volume is 28 %. The left ventricle has moderately decreased function. The left ventricle demonstrates global hypokinesis. The average left ventricular global longitudinal strain is -4.5 %. Strain was performed and the global longitudinal strain is abnormal. The left ventricular internal cavity size was normal in size. There is no left ventricular hypertrophy. Left ventricular diastolic parameters are consistent with Grade I diastolic dysfunction (impaired relaxation). Right Ventricle: The right ventricular size is normal. No increase in right ventricular wall thickness. Right ventricular systolic function is normal. There is normal pulmonary artery systolic pressure. The tricuspid regurgitant velocity is 1.72 m/s, and  with an assumed right atrial pressure of 5 mmHg, the estimated right ventricular systolic pressure is 16.8 mmHg. Left Atrium: Left atrial size was normal in size. Right Atrium: Right atrial size was normal in size. Pericardium: There is no evidence of pericardial effusion. Mitral Valve: The mitral valve is normal in structure. No evidence of mitral valve regurgitation. No evidence of mitral valve stenosis. Tricuspid Valve: The tricuspid valve is normal in structure. Tricuspid valve regurgitation is not demonstrated. No  evidence of tricuspid stenosis. Aortic Valve: The aortic valve is normal in structure. Aortic valve regurgitation is not visualized. No aortic stenosis is present. Aortic valve mean gradient measures 2.0 mmHg. Aortic valve peak gradient measures 4.0 mmHg. Aortic valve area, by VTI measures 2.41 cm. Pulmonic Valve: The pulmonic valve was normal in structure. Pulmonic valve regurgitation is not visualized. No evidence of pulmonic stenosis. Aorta: The aortic root is normal in size and structure. Venous: The inferior vena cava is normal in size with greater than 50% respiratory variability, suggesting right atrial pressure of 3 mmHg. IAS/Shunts: No atrial level shunt detected by color flow Doppler. Additional Comments: 3D was performed not requiring image post processing on an  independent workstation and was abnormal.  LEFT VENTRICLE PLAX 2D LV EF:         Left            Diastology                ventricular     LV e' medial:    6.42 cm/s                ejection        LV E/e' medial:  8.6                fraction by     LV e' lateral:   12.80 cm/s                PLAX is 32      LV E/e' lateral: 4.3                %. LVIDd:         5.20 cm         2D Longitudinal LVIDs:         4.40 cm         Strain LV PW:         1.50 cm         2D Strain GLS   -4.5 % LV IVS:        1.10 cm         Avg: LVOT diam:     2.30 cm LV SV:         39              3D Volume EF LV SV Index:   14              LV 3D EF:    Left LVOT Area:     4.15 cm                     ventricul                                             ar                                             ejection LV Volumes (MOD)                            fraction LV vol d, MOD    160.0 ml                   by 3D A2C:                                        volume is LV vol d, MOD    149.0 ml                   28 %. A4C: LV vol s, MOD    125.0 ml A4C:                           3D Volume EF: LV  SV MOD A4C:   149.0 ml      3D EF:        28 % RIGHT VENTRICLE RV Basal diam:  3.70 cm  RV Mid diam:    3.20 cm LEFT ATRIUM             Index        RIGHT ATRIUM           Index LA diam:        3.50 cm 1.22 cm/m   RA Area:     12.50 cm LA Vol (A2C):   41.8 ml 14.54 ml/m  RA Volume:   25.50 ml  8.87 ml/m LA Vol (A4C):   33.8 ml 11.76 ml/m LA Biplane Vol: 38.8 ml 13.50 ml/m  AORTIC VALVE AV Area (Vmax):    2.39 cm AV Area (Vmean):   2.30 cm AV Area (VTI):     2.41 cm AV Vmax:           100.00 cm/s AV Vmean:          69.300 cm/s AV VTI:            0.163 m AV Peak Grad:      4.0 mmHg AV Mean Grad:      2.0 mmHg LVOT Vmax:         57.50 cm/s LVOT Vmean:        38.300 cm/s LVOT VTI:          0.095 m LVOT/AV VTI ratio: 0.58  AORTA Ao Root diam: 3.40 cm MITRAL VALVE               TRICUSPID VALVE MV Area (PHT): 5.46 cm    TR Peak grad:   11.8 mmHg MV Decel Time: 139 msec    TR Vmax:        172.00 cm/s MV E velocity: 55.50 cm/s MV A velocity: 69.70 cm/s  SHUNTS MV E/A ratio:  0.80        Systemic VTI:  0.09 m                            Systemic Diam: 2.30 cm Julien Nordmann MD Electronically signed by Julien Nordmann MD Signature Date/Time: 03/20/2023/3:14:11 PM    Final      Echo LVEF 30-35%  TELEMETRY: Sinus rhythm 82 bpm:  ASSESSMENT AND PLAN:  Principal Problem:   Acute saddle pulmonary embolism with acute cor pulmonale (HCC) Active Problems:   Morbid obesity with BMI of 50.0-59.9, adult (HCC)   Tobacco use disorder   Baker's cyst of knee, right   Acute pulmonary embolism (HCC)    1.  Dilated cardiomyopathy, LVEF 30-35%, with global hypokinesis, of unknown duration, in the setting of acute saddle pulmonary embolus, medical management initiated (metoprolol succinate, losartan, spironolactone), mild evidence of congestive heart failure 2.  Acute saddle pulmonary embolus, on heparin, transition to Eliquis, clinically and hemodynamically stable  Recommendations  1.  Continue current therapy 2.  Continue Eliquis 3.  Continue good medical management 4.  Patient clinically and  hemodynamically stable for discharge later today 5.  Follow-up with me 1 to 2 weeks   Marcina Millard, MD, PhD, [facility] 03/22/2023 9:06 AM

## 2023-03-22 NOTE — Progress Notes (Signed)
 Heart Failure Navigator Progress Note  Assessed for Heart & Vascular TOC clinic readiness.  Patient does not meet criteria due to [facility] patient of Dr. Marcina Millard, MD.  Navigator will sign off at this time.  Roxy Horseman, RN, BSN [facility] Heart Failure Navigator Secure Chat Only

## 2023-03-25 ENCOUNTER — Other Ambulatory Visit: Payer: Self-pay

## 2023-03-26 LAB — FACTOR 5 LEIDEN

## 2023-03-26 LAB — PROTHROMBIN GENE MUTATION

## 2023-04-17 DIAGNOSIS — I429 Cardiomyopathy, unspecified: Secondary | ICD-10-CM | POA: Diagnosis not present

## 2023-04-17 DIAGNOSIS — Z86711 Personal history of pulmonary embolism: Secondary | ICD-10-CM | POA: Diagnosis not present

## 2023-04-24 DIAGNOSIS — I429 Cardiomyopathy, unspecified: Secondary | ICD-10-CM | POA: Diagnosis not present

## 2023-05-01 ENCOUNTER — Other Ambulatory Visit: Payer: Self-pay

## 2023-05-01 MED ORDER — EMPAGLIFLOZIN 10 MG PO TABS: 10.0000 mg | ORAL_TABLET | Freq: Every day | ORAL | 3 refills | Status: AC

## 2023-05-01 MED ORDER — APIXABAN 5 MG PO TABS: 5.0000 mg | ORAL_TABLET | Freq: Two times a day (BID) | ORAL | 3 refills | Status: AC

## 2023-05-15 DIAGNOSIS — Z86711 Personal history of pulmonary embolism: Secondary | ICD-10-CM | POA: Diagnosis not present

## 2023-05-15 DIAGNOSIS — I429 Cardiomyopathy, unspecified: Secondary | ICD-10-CM | POA: Diagnosis not present

## 2023-05-16 ENCOUNTER — Other Ambulatory Visit: Payer: Self-pay | Admitting: Cardiology

## 2023-05-16 DIAGNOSIS — I429 Cardiomyopathy, unspecified: Secondary | ICD-10-CM

## 2023-06-05 ENCOUNTER — Telehealth (HOSPITAL_COMMUNITY): Payer: Self-pay | Admitting: *Deleted

## 2023-06-05 NOTE — Telephone Encounter (Signed)
 Attempted to call patient regarding upcoming cardiac PET appointment. Left message on voicemail with name and callback number  Chase Copping RN Navigator Cardiac Imaging [facility] [phone] Office [phone] Cell  Reminder to avoid caffeine 12 hour prior to cardiac PET study.

## 2023-06-06 ENCOUNTER — Ambulatory Visit: Admission: RE | Admit: 2023-06-06 | Payer: MEDICAID | Source: Ambulatory Visit

## 2023-06-07 ENCOUNTER — Observation Stay
Admission: EM | Admit: 2023-06-07 | Discharge: 2023-06-08 | Disposition: A | Discharge status: Home / Self Care | Attending: Internal Medicine | Admitting: Internal Medicine

## 2023-06-07 ENCOUNTER — Emergency Department

## 2023-06-07 ENCOUNTER — Other Ambulatory Visit: Payer: Self-pay

## 2023-06-07 DIAGNOSIS — D72829 Elevated white blood cell count, unspecified: Secondary | ICD-10-CM

## 2023-06-07 DIAGNOSIS — Z79899 Other long term (current) drug therapy: Secondary | ICD-10-CM | POA: Insufficient documentation

## 2023-06-07 DIAGNOSIS — I110 Hypertensive heart disease with heart failure: Secondary | ICD-10-CM | POA: Diagnosis not present

## 2023-06-07 DIAGNOSIS — I10 Essential (primary) hypertension: Secondary | ICD-10-CM | POA: Insufficient documentation

## 2023-06-07 DIAGNOSIS — R590 Localized enlarged lymph nodes: Secondary | ICD-10-CM | POA: Diagnosis not present

## 2023-06-07 DIAGNOSIS — I5022 Chronic systolic (congestive) heart failure: Secondary | ICD-10-CM | POA: Diagnosis not present

## 2023-06-07 DIAGNOSIS — F17200 Nicotine dependence, unspecified, uncomplicated: Secondary | ICD-10-CM | POA: Diagnosis present

## 2023-06-07 DIAGNOSIS — Z86711 Personal history of pulmonary embolism: Secondary | ICD-10-CM

## 2023-06-07 DIAGNOSIS — R509 Fever, unspecified: Secondary | ICD-10-CM | POA: Insufficient documentation

## 2023-06-07 DIAGNOSIS — Z7901 Long term (current) use of anticoagulants: Secondary | ICD-10-CM

## 2023-06-07 DIAGNOSIS — Z6841 Body Mass Index (BMI) 40.0 and over, adult: Secondary | ICD-10-CM | POA: Diagnosis not present

## 2023-06-07 DIAGNOSIS — R0602 Shortness of breath: Secondary | ICD-10-CM | POA: Diagnosis not present

## 2023-06-07 DIAGNOSIS — L03115 Cellulitis of right lower limb: Principal | ICD-10-CM

## 2023-06-07 DIAGNOSIS — M7989 Other specified soft tissue disorders: Secondary | ICD-10-CM | POA: Diagnosis not present

## 2023-06-07 LAB — CBC
HCT: 37.7 % — ABNORMAL LOW (ref 39.0–52.0)
Hemoglobin: 11.9 g/dL — ABNORMAL LOW (ref 13.0–17.0)
MCH: 27.4 pg (ref 26.0–34.0)
MCHC: 31.6 g/dL (ref 30.0–36.0)
MCV: 86.9 fL (ref 80.0–100.0)
Platelets: 306 10*3/uL (ref 150–400)
RBC: 4.34 MIL/uL (ref 4.22–5.81)
RDW: 14.8 % (ref 11.5–15.5)
WBC: 22.6 10*3/uL — ABNORMAL HIGH (ref 4.0–10.5)
nRBC: 0 % (ref 0.0–0.2)

## 2023-06-07 LAB — PROTIME-INR
INR: 1.2 (ref 0.8–1.2)
Prothrombin Time: 15.7 s — ABNORMAL HIGH (ref 11.4–15.2)

## 2023-06-07 LAB — BASIC METABOLIC PANEL WITH GFR
Anion gap: 8 (ref 5–15)
BUN: 14 mg/dL (ref 6–20)
CO2: 24 mmol/L (ref 22–32)
Calcium: 8.8 mg/dL — ABNORMAL LOW (ref 8.9–10.3)
Chloride: 103 mmol/L (ref 98–111)
Creatinine, Ser: 1.1 mg/dL (ref 0.61–1.24)
GFR, Estimated: >60 mL/min (ref >60)
Glucose, Bld: 104 mg/dL — ABNORMAL HIGH (ref 70–99)
Potassium: 3.3 mmol/L — ABNORMAL LOW (ref 3.5–5.1)
Sodium: 135 mmol/L (ref 135–145)

## 2023-06-07 LAB — SEDIMENTATION RATE: Sed Rate: 65 mm/h — ABNORMAL HIGH (ref 0–15)

## 2023-06-07 MED ORDER — ACETAMINOPHEN 325 MG PO TABS
650.0000 mg | ORAL_TABLET | Freq: Four times a day (QID) | ORAL | Status: DC | PRN
Start: 2023-06-07 — End: 2023-06-08

## 2023-06-07 MED ORDER — METOPROLOL SUCCINATE ER 25 MG PO TB24
25.0000 mg | ORAL_TABLET | Freq: Every day | ORAL | Status: DC
  Administered 2023-06-08: 25 mg via ORAL
  Filled 2023-06-07 (×2): qty 1

## 2023-06-07 MED ORDER — ONDANSETRON HCL 4 MG PO TABS
4.0000 mg | ORAL_TABLET | Freq: Four times a day (QID) | ORAL | Status: DC | PRN
Start: 2023-06-07 — End: 2023-06-08

## 2023-06-07 MED ORDER — MORPHINE SULFATE (PF) 2 MG/ML IV SOLN: 2.0000 mg | INTRAVENOUS | Status: DC | PRN

## 2023-06-07 MED ORDER — ONDANSETRON HCL 4 MG/2ML IJ SOLN: 4.0000 mg | Freq: Four times a day (QID) | INTRAMUSCULAR | Status: DC | PRN

## 2023-06-07 MED ORDER — ACETAMINOPHEN 650 MG RE SUPP: 650.0000 mg | Freq: Four times a day (QID) | RECTAL | Status: DC | PRN

## 2023-06-07 MED ORDER — APIXABAN 5 MG PO TABS
5.0000 mg | ORAL_TABLET | Freq: Two times a day (BID) | ORAL | Status: DC
  Administered 2023-06-07 – 2023-06-08 (×2): 5 mg via ORAL
  Filled 2023-06-07 (×2): qty 1

## 2023-06-07 MED ORDER — POTASSIUM CHLORIDE CRYS ER 20 MEQ PO TBCR
40.0000 meq | EXTENDED_RELEASE_TABLET | Freq: Once | ORAL | Status: AC
  Administered 2023-06-07: 40 meq via ORAL
  Filled 2023-06-07: qty 2

## 2023-06-07 MED ORDER — KETOROLAC TROMETHAMINE 30 MG/ML IJ SOLN
30.0000 mg | Freq: Four times a day (QID) | INTRAMUSCULAR | Status: DC | PRN
Start: 2023-06-07 — End: 2023-06-08
  Administered 2023-06-07: 30 mg via INTRAVENOUS
  Filled 2023-06-07: qty 1

## 2023-06-07 MED ORDER — NICOTINE 21 MG/24HR TD PT24
21.0000 mg | MEDICATED_PATCH | Freq: Every day | TRANSDERMAL | Status: DC
  Filled 2023-06-07: qty 1

## 2023-06-07 MED ORDER — SODIUM CHLORIDE 0.9 % IV SOLN: 1.0000 g | INTRAVENOUS | Status: DC

## 2023-06-07 MED ORDER — SODIUM CHLORIDE 0.9 % IV SOLN
2.0000 g | Freq: Once | INTRAVENOUS | Status: AC
  Administered 2023-06-07: 2 g via INTRAVENOUS
  Filled 2023-06-07: qty 20

## 2023-06-07 MED ORDER — EMPAGLIFLOZIN 10 MG PO TABS
10.0000 mg | ORAL_TABLET | Freq: Every day | ORAL | Status: DC
  Administered 2023-06-08: 10 mg via ORAL
  Filled 2023-06-07: qty 1

## 2023-06-07 MED ORDER — LOSARTAN POTASSIUM 50 MG PO TABS
50.0000 mg | ORAL_TABLET | Freq: Every day | ORAL | Status: DC
  Administered 2023-06-08: 50 mg via ORAL
  Filled 2023-06-07: qty 1

## 2023-06-07 MED ORDER — HYDROCODONE-ACETAMINOPHEN 5-325 MG PO TABS: 1.0000 | ORAL_TABLET | ORAL | Status: DC | PRN

## 2023-06-07 MED ORDER — SPIRONOLACTONE 12.5 MG HALF TABLET
12.5000 mg | ORAL_TABLET | Freq: Every day | ORAL | Status: DC
  Administered 2023-06-08: 12.5 mg via ORAL
  Filled 2023-06-07: qty 1

## 2023-06-07 NOTE — ED Provider Notes (Signed)
 [facility] Provider Note    Event Date/Time   First MD Initiated Contact with Patient 06/07/23 1845     (approximate)   History   Leg Swelling and Shortness of Breath   HPI  Peter Sparks is a 38 y.o. male history of PE on Eliquis  and obesity who presents with right lower leg swelling and pain, acute onset yesterday, worsening today, associated with some chills and subjective fever when it first started.  He denies any weakness or numbness.  He states that he is compliant with his Eliquis .  He has no chest pain or difficulty breathing.  I reviewed the past medical records.  The patient was admitted to the hospitalist service in March with an acute saddle PE with cor pulmonale.  He had a thrombectomy by vascular surgery and was started on Eliquis .   Physical Exam   Triage Vital Signs: ED Triage Vitals  Encounter Vitals Group     BP 06/07/23 1756 135/77     Systolic BP Percentile --      Diastolic BP Percentile --      Pulse Rate 06/07/23 1756 80     Resp 06/07/23 1756 16     Temp 06/07/23 1756 98.8 F (37.1 C)     Temp Source 06/07/23 1756 Oral     SpO2 06/07/23 1756 99 %     Weight 06/07/23 1754 (!) 375 lb (170.1 kg)     Height 06/07/23 1754 6\' 1"  (1.854 m)     Head Circumference --      Peak Flow --      Pain Score 06/07/23 1754 9     Pain Loc --      Pain Education --      Exclude from Growth Chart --     Most recent vital signs: Vitals:   06/07/23 1756 06/07/23 2100  BP: 135/77 129/71  Pulse: 80 73  Resp: 16 20  Temp: 98.8 F (37.1 C) 98.5 F (36.9 C)  SpO2: 99%      General: Awake, no distress.  CV:  Good peripheral perfusion.  Resp:  Normal effort.  Abd:  No distention.  Other:  Right lower leg with slight swelling.  Faint erythema and induration.  Mild tenderness to the distal thigh.  No proximal erythema or streaking.  Full range of motion at the ankle and knee.  2+ DP pulse.   ED Results / Procedures / Treatments    Labs (all labs ordered are listed, but only abnormal results are displayed) Labs Reviewed  BASIC METABOLIC PANEL WITH GFR - Abnormal; Notable for the following components:      Result Value   Potassium 3.3 (*)    Glucose, Bld 104 (*)    Calcium 8.8 (*)    All other components within normal limits  CBC - Abnormal; Notable for the following components:   WBC 22.6 (*)    Hemoglobin 11.9 (*)    HCT 37.7 (*)    All other components within normal limits  PROTIME-INR - Abnormal; Notable for the following components:   Prothrombin Time 15.7 (*)    All other components within normal limits  SEDIMENTATION RATE - Abnormal; Notable for the following components:   Sed Rate 65 (*)    All other components within normal limits  CULTURE, BLOOD (ROUTINE X 2)  BASIC METABOLIC PANEL WITH GFR  CBC     EKG    RADIOLOGY  Chest x-ray: I independently viewed and  interpreted the images; there is no focal consolidation or edema  US  venous RLE:  IMPRESSION:  1. No evidence of right lower extremity DVT.  2. Prominent right groin lymph node may be reactive, but is  nonspecific. Clinical follow-up is recommended.    PROCEDURES:  Critical Care performed: No  Procedures   MEDICATIONS ORDERED IN ED: Medications  losartan  (COZAAR ) tablet 50 mg (has no administration in time range)  metoprolol  succinate (TOPROL -XL) 24 hr tablet 25 mg (has no administration in time range)  spironolactone  (ALDACTONE ) tablet 12.5 mg (has no administration in time range)  empagliflozin  (JARDIANCE ) tablet 10 mg (has no administration in time range)  apixaban  (ELIQUIS ) tablet 5 mg (5 mg Oral Given 06/07/23 2202)  acetaminophen  (TYLENOL ) tablet 650 mg (has no administration in time range)    Or  acetaminophen  (TYLENOL ) suppository 650 mg (has no administration in time range)  ondansetron  (ZOFRAN ) tablet 4 mg (has no administration in time range)    Or  ondansetron  (ZOFRAN ) injection 4 mg (has no administration in  time range)  HYDROcodone -acetaminophen  (NORCO/VICODIN) 5-325 MG per tablet 1-2 tablet (has no administration in time range)  ketorolac  (TORADOL ) 30 MG/ML injection 30 mg (30 mg Intravenous Given 06/07/23 2202)  morphine (PF) 2 MG/ML injection 2 mg (has no administration in time range)  nicotine  (NICODERM CQ  - dosed in mg/24 hours) patch 21 mg (21 mg Transdermal Not Given 06/07/23 2202)  cefTRIAXone  (ROCEPHIN ) 1 g in sodium chloride  0.9 % 100 mL IVPB (has no administration in time range)  cefTRIAXone  (ROCEPHIN ) 2 g in sodium chloride  0.9 % 100 mL IVPB (0 g Intravenous Stopped 06/07/23 2133)  potassium chloride SA (KLOR-CON M) CR tablet 40 mEq (40 mEq Oral Given 06/07/23 2201)     IMPRESSION / MDM / ASSESSMENT AND PLAN / ED COURSE  I reviewed the triage vital signs and the nursing notes.  38 year old male with PMH as noted above presents with right lower leg swelling and pain over the last day.  On exam there is slight erythema and induration in addition to the swelling.  The extremity is neuro/vascular intact.  Differential diagnosis includes, but is not limited to, cellulitis, less likely DVT, venous stasis, peripheral edema.  We will obtain labs, ultrasound, and reassess.  Patient's presentation is most consistent with acute complicated illness / injury requiring diagnostic workup.  ----------------------------------------- 8:49 PM on 06/07/2023 -----------------------------------------   Ultrasound is negative for DVT.  WBC count is 22 and ESR is also elevated.  Given the high white count and the relatively rapid onset of the symptoms as well as the patient's comorbidities, I feel that he would benefit from inpatient admission for IV antibiotics.  I consulted Dr. Vallarie Gauze from the hospitalist service; based on our discussion she agrees to evaluate the patient for admission.   FINAL CLINICAL IMPRESSION(S) / ED DIAGNOSES   Final diagnoses:  Cellulitis of right leg     Rx / DC Orders   ED  Discharge Orders     None        Note:  This document was prepared using Dragon voice recognition software and may include unintentional dictation errors.    Lind Repine, MD 06/07/23 [phone]

## 2023-06-07 NOTE — Assessment & Plan Note (Signed)
Chronic anticoagulation  Continue Eliquis 

## 2023-06-07 NOTE — Assessment & Plan Note (Signed)
 Continue home meds

## 2023-06-07 NOTE — H&P (Addendum)
 History and Physical    Patient: Peter Sparks UJW:[id number] DOB: [birth year] DOA: 06/07/2023 DOS: the patient was seen and examined on 06/07/2023 PCP: Patient, No Pcp Per  Patient coming from: Home  Chief Complaint:  Chief Complaint  Patient presents with   Leg Swelling   Shortness of Breath    HPI: Peter Sparks is a 38 y.o. 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  He has associated chills and subjective fever.  He is compliant with Eliquis .  Discharge summary from March 2025 reviewed and patient was diagnosed via ultrasound with a right Baker's cyst at that time. ED course and data review: Vitals within normal limits. Labs notable for WBC 22,000, potassium 3.3,Hemoglobin at baseline at 11.9 Venous ultrasound right leg negative for DVT and without comment on prior Baker's cyst.  Possible reactive right groin lymph node. Chest x-ray nonacute  Patient started on Rocephin   Hospitalist consulted for admission.     Review of Systems: As mentioned in the history of present illness. All other systems reviewed and are negative.  No past medical history on file. Past Surgical History:  Procedure Laterality Date   PULMONARY THROMBECTOMY Bilateral 03/20/2023   Procedure: PULMONARY THROMBECTOMY;  Surgeon: Celso College, MD;  Location: [facility] INVASIVE CV LAB;  Service: Cardiovascular;  Laterality: Bilateral;   Social History:  reports that he has been smoking. He has never used smokeless tobacco. He reports current alcohol use. He reports current drug use. Drug: Marijuana.  No Known Allergies  No family history on file.  Prior to Admission medications   Medication Sig Start Date End Date Taking? Authorizing Provider  apixaban  (ELIQUIS ) 5 MG TABS tablet Take 2  tablets (10 mg total) by mouth 2 (two) times daily for 6 days, THEN 1 tablet (5 mg total) 2 (two) times daily. 03/22/23 04/27/23  Aisha Hove, MD  apixaban  (ELIQUIS ) 5 MG TABS tablet Take 1 tablet (5 mg total) by mouth 2 (two) times daily. 03/21/23     empagliflozin  (JARDIANCE ) 10 MG TABS tablet Take 1 tablet (10 mg total) by mouth daily. 03/21/23     losartan  (COZAAR ) 50 MG tablet Take 1 tablet (50 mg total) by mouth daily. 03/23/23   Aisha Hove, MD  metoprolol  succinate (TOPROL -XL) 25 MG 24 hr tablet Take 1 tablet (25 mg total) by mouth daily. 03/23/23   Aisha Hove, MD  spironolactone  (ALDACTONE ) 25 MG tablet Take 0.5 tablets (12.5 mg total) by mouth daily. 03/23/23   Aisha Hove, MD    Physical Exam: Vitals:   06/07/23 1754 06/07/23 1756  BP:  135/77  Pulse:  80  Resp:  16  Temp:  98.8 F (37.1 C)  TempSrc:  Oral  SpO2:  99%  Weight: (!) 170.1 kg   Height: 6\' 1"  (1.854 m)    Physical Exam Vitals and nursing note reviewed.  Constitutional:      General: He is not in acute distress. HENT:     Head: Normocephalic and atraumatic.  Cardiovascular:     Rate and Rhythm: Normal rate and regular rhythm.     Heart sounds: Normal heart sounds.  Pulmonary:     Effort: Pulmonary effort is normal.     Breath sounds: Normal breath sounds.  Abdominal:     Palpations: Abdomen is  soft.     Tenderness: There is no abdominal tenderness.  Musculoskeletal:     Comments: Pain redness and swelling right lower extremity. See pic below  Neurological:     Mental Status: Mental status is at baseline.     Labs on Admission: I have personally reviewed following labs and imaging studies  CBC: Recent Labs  Lab 06/07/23 1759  WBC 22.6*  HGB 11.9*  HCT 37.7*  MCV 86.9  PLT 306   Basic Metabolic Panel: Recent Labs  Lab 06/07/23 1759  NA 135  K 3.3*  CL 103  CO2 24  GLUCOSE 104*  BUN 14  CREATININE 1.10  CALCIUM 8.8*   GFR: Estimated Creatinine  Clearance: 153.8 mL/min (by C-G formula based on SCr of 1.1 mg/dL). Liver Function Tests: No results for input(s): "AST", "ALT", "ALKPHOS", "BILITOT", "PROT", "ALBUMIN" in the last 168 hours. No results for input(s): "LIPASE", "AMYLASE" in the last 168 hours. No results for input(s): "AMMONIA" in the last 168 hours. Coagulation Profile: Recent Labs  Lab 06/07/23 1759  INR 1.2   Cardiac Enzymes: No results for input(s): "CKTOTAL", "CKMB", "CKMBINDEX", "TROPONINI" in the last 168 hours. BNP (last 3 results) No results for input(s): "PROBNP" in the last 8760 hours. HbA1C: No results for input(s): "HGBA1C" in the last 72 hours. CBG: No results for input(s): "GLUCAP" in the last 168 hours. Lipid Profile: No results for input(s): "CHOL", "HDL", "LDLCALC", "TRIG", "CHOLHDL", "LDLDIRECT" in the last 72 hours. Thyroid Function Tests: No results for input(s): "TSH", "T4TOTAL", "FREET4", "T3FREE", "THYROIDAB" in the last 72 hours. Anemia Panel: No results for input(s): "VITAMINB12", "FOLATE", "FERRITIN", "TIBC", "IRON", "RETICCTPCT" in the last 72 hours. Urine analysis:    Component Value Date/Time   COLORURINE YELLOW (A) 03/19/2023 2102   APPEARANCEUR HAZY (A) 03/19/2023 2102   LABSPEC 1.029 03/19/2023 2102   PHURINE 5.0 03/19/2023 2102   GLUCOSEU NEGATIVE 03/19/2023 2102   HGBUR NEGATIVE 03/19/2023 2102   BILIRUBINUR NEGATIVE 03/19/2023 2102   KETONESUR NEGATIVE 03/19/2023 2102   PROTEINUR 30 (A) 03/19/2023 2102   NITRITE NEGATIVE 03/19/2023 2102   LEUKOCYTESUR NEGATIVE 03/19/2023 2102    Radiological Exams on Admission: US  Venous Img Lower Unilateral Right Result Date: 06/07/2023 CLINICAL DATA:  Right leg swelling. EXAM: RIGHT LOWER EXTREMITY VENOUS DOPPLER ULTRASOUND TECHNIQUE: Gray-scale sonography with compression, as well as color and duplex ultrasound, were performed to evaluate the deep venous system(s) from the level of the common femoral vein through the popliteal and  proximal calf veins. COMPARISON:  03/19/2023 FINDINGS: VENOUS Normal compressibility of the common femoral, superficial femoral, and popliteal veins, as well as the visualized calf veins. Visualized portions of profunda femoral vein and great saphenous vein unremarkable. No filling defects to suggest DVT on grayscale or color Doppler imaging. Doppler waveforms show normal direction of venous flow, normal respiratory plasticity and response to augmentation. Limited views of the contralateral common femoral vein are unremarkable. OTHER Prominent lymph node in the right groin, 1.3 cm short axis. This retains normal nodal morphology and fatty hila. Limitations: none IMPRESSION: 1. No evidence of right lower extremity DVT. 2. Prominent right groin lymph node may be reactive, but is nonspecific. Clinical follow-up is recommended. Electronically Signed   By: Chadwick Colonel M.D.   On: 06/07/2023 19:48   DG Chest 2 View Result Date: 06/07/2023 CLINICAL DATA:  Shortness of breath EXAM: CHEST - 2 VIEW COMPARISON:  03/19/2023 FINDINGS: The heart size and mediastinal contours are within normal limits. Both lungs are clear. The  visualized skeletal structures are unremarkable. IMPRESSION: No active cardiopulmonary disease. Electronically Signed   By: Janeece Mechanic M.D.   On: 06/07/2023 18:29   Data Reviewed for HPI: Relevant notes from primary care and specialist visits, past discharge summaries as available in EHR, including Care Everywhere. 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 Observation status is judged to be reasonable and necessary in order to provide the required intensity of service to ensure the patient's safety. The patient's presenting symptoms, physical exam findings, and initial radiographic and laboratory data in the context of their medical condition is felt to place them at decreased risk for further clinical deterioration. Furthermore, it is anticipated that the patient will be medically stable for discharge from the hospital within 2 midnights of admission.   Author: Lanetta Pion, MD 06/07/2023 8:54 PM  For on call review [email or web address].

## 2023-06-07 NOTE — Assessment & Plan Note (Addendum)
 Patient switched over to Ancef  and dose given before discharge.  Keflex will be given upon going home.  Recommend calling Medicaid to set up a primary care physician.

## 2023-06-07 NOTE — Assessment & Plan Note (Signed)
 Nicotine  patch

## 2023-06-07 NOTE — Hospital Course (Signed)
 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  He has associated chills and subjective fever.  He is compliant with Eliquis .  Discharge summary from March 2025 reviewed and patient was diagnosed via ultrasound with a right Baker's cyst at that time. ED course and data review: Vitals within normal limits. Labs notable for WBC 22,000, potassium 3.3,Hemoglobin at baseline at 11.9 Venous ultrasound right leg negative for DVT and without comment on prior Baker's cyst.  Possible reactive right groin lymph node. Chest x-ray nonacute  Patient started on Rocephin   Hospitalist consulted for admission.

## 2023-06-07 NOTE — ED Triage Notes (Addendum)
 C/o R leg redness and pain x1 day and SOB. Dx with saddle PE in March, taking eliquis  BID. Denies CP, dizziness. Pt also reports blood in urine x1 day. GCS 15, ambulatory in triage.

## 2023-06-07 NOTE — Assessment & Plan Note (Addendum)
 BMI 53.17.

## 2023-06-08 ENCOUNTER — Encounter: Payer: Self-pay | Admitting: Internal Medicine

## 2023-06-08 DIAGNOSIS — I10 Essential (primary) hypertension: Secondary | ICD-10-CM | POA: Diagnosis not present

## 2023-06-08 DIAGNOSIS — D72829 Elevated white blood cell count, unspecified: Secondary | ICD-10-CM

## 2023-06-08 DIAGNOSIS — F17200 Nicotine dependence, unspecified, uncomplicated: Secondary | ICD-10-CM

## 2023-06-08 DIAGNOSIS — I5022 Chronic systolic (congestive) heart failure: Secondary | ICD-10-CM | POA: Diagnosis not present

## 2023-06-08 DIAGNOSIS — Z6841 Body Mass Index (BMI) 40.0 and over, adult: Secondary | ICD-10-CM | POA: Diagnosis not present

## 2023-06-08 DIAGNOSIS — L03115 Cellulitis of right lower limb: Secondary | ICD-10-CM | POA: Diagnosis not present

## 2023-06-08 DIAGNOSIS — Z86711 Personal history of pulmonary embolism: Secondary | ICD-10-CM

## 2023-06-08 LAB — CBC
HCT: 38 % — ABNORMAL LOW (ref 39.0–52.0)
Hemoglobin: 12.3 g/dL — ABNORMAL LOW (ref 13.0–17.0)
MCH: 27.9 pg (ref 26.0–34.0)
MCHC: 32.4 g/dL (ref 30.0–36.0)
MCV: 86.2 fL (ref 80.0–100.0)
Platelets: 286 10*3/uL (ref 150–400)
RBC: 4.41 MIL/uL (ref 4.22–5.81)
RDW: 14.8 % (ref 11.5–15.5)
WBC: 12.7 10*3/uL — ABNORMAL HIGH (ref 4.0–10.5)
nRBC: 0 % (ref 0.0–0.2)

## 2023-06-08 LAB — BASIC METABOLIC PANEL WITH GFR
Anion gap: 6 (ref 5–15)
BUN: 16 mg/dL (ref 6–20)
CO2: 25 mmol/L (ref 22–32)
Calcium: 8.7 mg/dL — ABNORMAL LOW (ref 8.9–10.3)
Chloride: 106 mmol/L (ref 98–111)
Creatinine, Ser: 1 mg/dL (ref 0.61–1.24)
GFR, Estimated: >60 mL/min (ref >60)
Glucose, Bld: 102 mg/dL — ABNORMAL HIGH (ref 70–99)
Potassium: 3.9 mmol/L (ref 3.5–5.1)
Sodium: 137 mmol/L (ref 135–145)

## 2023-06-08 MED ORDER — CEFAZOLIN SODIUM-DEXTROSE 2-4 GM/100ML-% IV SOLN: 2.0000 g | Freq: Three times a day (TID) | INTRAVENOUS | Status: DC

## 2023-06-08 MED ORDER — CEPHALEXIN 500 MG PO CAPS: 500.0000 mg | ORAL_CAPSULE | Freq: Four times a day (QID) | ORAL | 0 refills | Status: AC

## 2023-06-08 MED ORDER — CEFAZOLIN SODIUM-DEXTROSE 2-4 GM/100ML-% IV SOLN
2.0000 g | Freq: Three times a day (TID) | INTRAVENOUS | Status: DC
  Administered 2023-06-08: 2 g via INTRAVENOUS
  Filled 2023-06-08: qty 100

## 2023-06-08 NOTE — Discharge Instructions (Signed)
 Some PCP options in [place] area- not a comprehensive list  [facility]- [phone] [facility]- [phone] [facility]- [phone] [facility]- [phone] [facility]- [phone] [facility]- [phone]  or [facility] Physician Referral Line [phone]

## 2023-06-08 NOTE — Plan of Care (Signed)

## 2023-06-08 NOTE — Progress Notes (Signed)
 Patient is being discharged home. All discharge instructions were reviewed with patient including new medications, medication schedule, and where to pick up new medications. Patient verbalized a full understanding of all instructions. Patients brother is his ride home.

## 2023-06-08 NOTE — Hospital Course (Signed)
 38 y.o. 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  He has associated chills and subjective fever.  He is compliant with Eliquis .  Discharge summary from March 2025 reviewed and patient was diagnosed via ultrasound with a right Baker's cyst at that time. ED course and data review: Vitals within normal limits. Labs notable for WBC 22,000, potassium 3.3,Hemoglobin at baseline at 11.9 Venous ultrasound right leg negative for DVT and without comment on prior Baker's cyst.  Possible reactive right groin lymph node. Chest x-ray nonacute   Patient started on Rocephin    Hospitalist consulted for admission.  6/7.  Switched antibiotics over to Ancef  and Keflex upon discharge.  Patient felt better and wanted to go home.

## 2023-06-08 NOTE — Assessment & Plan Note (Signed)
 Last EF 30%.  Continue Entresto , Jardiance , Toprol , spironolactone .  No signs of heart failure currently.

## 2023-06-08 NOTE — Assessment & Plan Note (Signed)
 Secondary to cellulitis.  White blood cell count came down from 22.6 down to 12.7.  Patient feeling better wanted to go home.  Will prescribe Keflex upon going home to treat cellulitis.

## 2023-06-08 NOTE — Discharge Summary (Signed)
 Physician Discharge Summary   Patient: JAIVEER PANAS MRN: [id number] DOB: 12/04/1985  Admit date:     06/07/2023  Discharge date: 06/08/23  Discharge Physician: Verla Glaze   PCP: Patient, No Pcp Per   Recommendations at discharge:   You have to call Medicaid to schedule your PCP.  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  He has associated chills and subjective fever.  He is compliant with Eliquis .  Discharge summary from March 2025 reviewed and patient was diagnosed via ultrasound with a right Baker's cyst at that time. ED course and data review: Vitals within normal limits. Labs notable for WBC 22,000, potassium 3.3,Hemoglobin at baseline at 11.9 Venous ultrasound right leg negative for DVT and without comment on prior Baker's cyst.  Possible reactive right groin lymph node. Chest x-ray nonacute   Patient started on Rocephin    Hospitalist consulted for admission.  6/7.  Switched antibiotics over to Ancef  and Keflex upon discharge.  Patient felt better and wanted to go home.   Assessment and Plan: * Cellulitis of right lower extremity Patient switched over to Ancef  and dose given before discharge.  Keflex will be given upon going home.  Recommend calling Medicaid to set up a primary care physician.  Leukocytosis Secondary to cellulitis.  White blood cell count came down from 22.6  down to 12.7.  Patient feeling better wanted to go home.  Will prescribe Keflex upon going home to treat cellulitis.  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  Continue Entresto , Jardiance , Toprol , spironolactone .  No signs of heart failure currently.  HTN (hypertension) Continue home meds  Tobacco use disorder Nicotine  patch  Morbid obesity with BMI of 50.0-59.9, adult (HCC) BMI 53.17.         Consultants: None Procedures performed: None Disposition: Home Diet recommendation:  Cardiac diet DISCHARGE MEDICATION: Allergies as of 06/08/2023   No Known Allergies      Medication List     STOP taking these medications    losartan  50 MG tablet Commonly known as: COZAAR        TAKE these medications    apixaban  5 MG Tabs tablet Commonly known as: ELIQUIS  Take 1 tablet (5 mg total) by mouth 2 (two) times daily. What changed: Another medication with the same name was removed. Continue taking this medication, and follow the directions you see here.   cephALEXin 500 MG capsule Commonly known as: KEFLEX Take 1 capsule (500 mg total) by mouth 4 (four) times daily for 6 days.   empagliflozin  10 MG Tabs tablet Commonly known as: JARDIANCE  Take 1 tablet (10 mg total) by mouth daily.   metoprolol  succinate 25 MG 24 hr tablet Commonly known as: TOPROL -XL Take 1 tablet (25 mg total) by mouth daily.   sacubitril -valsartan  49-51 MG Commonly known as: ENTRESTO  Take 1 tablet by mouth 2 (two)  times daily.   spironolactone  25 MG tablet Commonly known as: ALDACTONE  Take 0.5 tablets (12.5 mg total) by mouth daily.        Discharge Exam: Filed Weights   06/07/23 1754 06/07/23 2100  Weight: (!) 170.1 kg (!) 182.8 kg   Physical Exam HENT:     Head: Normocephalic.     Mouth/Throat:     Pharynx: No oropharyngeal exudate.  Eyes:     General: Lids are  normal.     Conjunctiva/sclera: Conjunctivae normal.  Cardiovascular:     Rate and Rhythm: Normal rate and regular rhythm.     Heart sounds: Normal heart sounds, S1 normal and S2 normal.  Pulmonary:     Breath sounds: No decreased breath sounds, wheezing, rhonchi or rales.  Musculoskeletal:     Right lower leg: Swelling present.     Left lower leg: No swelling.  Skin:    General: Skin is warm.     Comments: Slight darkening of skin right lower extremity with slight warmth.  Neurological:     Mental Status: He is alert and oriented to person, place, and time.      Condition at discharge: stable  The results of significant diagnostics from this hospitalization (including imaging, microbiology, ancillary and laboratory) are listed below for reference.   Imaging Studies: US  Venous Img Lower Unilateral Right Result Date: 06/07/2023 CLINICAL DATA:  Right leg swelling. EXAM: RIGHT LOWER EXTREMITY VENOUS DOPPLER ULTRASOUND TECHNIQUE: Gray-scale sonography with compression, as well as color and duplex ultrasound, were performed to evaluate the deep venous system(s) from the level of the common femoral vein through the popliteal and proximal calf veins. COMPARISON:  03/19/2023 FINDINGS: VENOUS Normal compressibility of the common femoral, superficial femoral, and popliteal veins, as well as the visualized calf veins. Visualized portions of profunda femoral vein and great saphenous vein unremarkable. No filling defects to suggest DVT on grayscale or color Doppler imaging. Doppler waveforms show normal direction of venous flow, normal respiratory plasticity and response to augmentation. Limited views of the contralateral common femoral vein are unremarkable. OTHER Prominent lymph node in the right groin, 1.3 cm short axis. This retains normal nodal morphology and fatty hila. Limitations: none IMPRESSION: 1. No evidence of right lower extremity DVT. 2. Prominent right groin lymph node may be reactive, but  is nonspecific. Clinical follow-up is recommended. Electronically Signed   By: Chadwick Colonel M.D.   On: 06/07/2023 19:48   DG Chest 2 View Result Date: 06/07/2023 CLINICAL DATA:  Shortness of breath EXAM: CHEST - 2 VIEW COMPARISON:  03/19/2023 FINDINGS: The heart size and mediastinal contours are within normal limits. Both lungs are clear. The visualized skeletal structures are unremarkable. IMPRESSION: No active cardiopulmonary disease. Electronically Signed   By: Janeece Mechanic M.D.   On: 06/07/2023 18:29    Labs: CBC: Recent Labs  Lab 06/07/23 1759 06/08/23 0832  WBC 22.6* 12.7*  HGB 11.9* 12.3*  HCT 37.7* 38.0*  MCV 86.9 86.2  PLT 306 286   Basic Metabolic Panel: Recent Labs  Lab 06/07/23 1759 06/08/23 0832  NA 135 137  K 3.3* 3.9  CL 103 106  CO2 24 25  GLUCOSE 104* 102*  BUN 14 16  CREATININE 1.10 1.00  CALCIUM 8.8* 8.7*     Discharge time spent: greater than 30 minutes.  Signed: Verla Glaze, MD [facility] 06/08/2023

## 2023-06-08 NOTE — TOC Transition Note (Signed)
 Transition of Care [facility]) - Discharge Note   Patient Details  Name: Peter Sparks MRN: [id number] Date of Birth: 26-May-1985  Transition of Care [facility]) CM/SW Contact:  Alexandra Ice, RN Phone Number: 06/08/2023, 11:11 AM   Clinical Narrative:     Patient has discharge order in place. Patient to return home. No TOC needs identified at this time, will continue to follow.  Final next level of care: Home/Self Care Barriers to Discharge: Barriers Resolved   Patient Goals and CMS Choice            Discharge Placement                    Patient and family notified of of transfer: 06/08/23  Discharge Plan and Services Additional resources added to the After Visit Summary for                    DME Agency: NA       HH Arranged: NA          Social Drivers of Health (SDOH) Interventions SDOH Screenings   Food Insecurity: No Food Insecurity (06/07/2023)  Housing: Low Risk  (06/08/2023)  Transportation Needs: No Transportation Needs (06/08/2023)  Utilities: Not At Risk (06/08/2023)  Tobacco Use: High Risk (06/08/2023)     Readmission Risk Interventions     No data to display

## 2023-06-13 LAB — CULTURE, BLOOD (ROUTINE X 2): Culture: NO GROWTH

## 2023-06-19 DIAGNOSIS — I429 Cardiomyopathy, unspecified: Secondary | ICD-10-CM | POA: Diagnosis not present

## 2023-07-16 ENCOUNTER — Other Ambulatory Visit: Payer: Self-pay | Admitting: Cardiology

## 2023-07-16 DIAGNOSIS — I5022 Chronic systolic (congestive) heart failure: Secondary | ICD-10-CM

## 2023-07-16 NOTE — Progress Notes (Signed)
 Orders only for Cardiac PET stress test.   Dorene Comfort, PA-C

## 2023-07-18 ENCOUNTER — Ambulatory Visit: Admission: RE | Admit: 2023-07-18 | Payer: MEDICAID | Source: Ambulatory Visit

## 2023-08-15 ENCOUNTER — Ambulatory Visit: Admission: RE | Admit: 2023-08-15 | Payer: MEDICAID | Source: Ambulatory Visit

## 2023-08-20 ENCOUNTER — Encounter: Admission: RE | Admit: 2023-08-20 | Source: Ambulatory Visit

## 2023-10-07 ENCOUNTER — Ambulatory Visit
Admission: RE | Admit: 2023-10-07 | Discharge: 2023-10-07 | Disposition: A | Discharge status: Home / Self Care | Source: Ambulatory Visit | Attending: Nurse Practitioner | Admitting: Nurse Practitioner

## 2023-10-07 ENCOUNTER — Other Ambulatory Visit: Payer: Self-pay | Admitting: Nurse Practitioner

## 2023-10-07 ENCOUNTER — Encounter: Payer: Self-pay | Admitting: Radiology

## 2023-10-07 DIAGNOSIS — R0600 Dyspnea, unspecified: Secondary | ICD-10-CM | POA: Diagnosis present

## 2023-10-07 DIAGNOSIS — Z86711 Personal history of pulmonary embolism: Secondary | ICD-10-CM

## 2023-10-07 DIAGNOSIS — R600 Localized edema: Secondary | ICD-10-CM | POA: Diagnosis present

## 2023-10-07 DIAGNOSIS — R0789 Other chest pain: Secondary | ICD-10-CM | POA: Insufficient documentation

## 2023-10-07 DIAGNOSIS — L03115 Cellulitis of right lower limb: Secondary | ICD-10-CM | POA: Insufficient documentation

## 2023-10-07 MED ORDER — IOHEXOL 350 MG/ML SOLN
75.0000 mL | Freq: Once | INTRAVENOUS | Status: AC | PRN
  Administered 2023-10-07: 75 mL via INTRAVENOUS

## 2023-10-08 ENCOUNTER — Other Ambulatory Visit: Payer: Self-pay | Admitting: Cardiology

## 2023-10-08 DIAGNOSIS — I429 Cardiomyopathy, unspecified: Secondary | ICD-10-CM

## 2023-10-09 ENCOUNTER — Other Ambulatory Visit: Payer: Self-pay | Admitting: Nurse Practitioner

## 2023-10-09 ENCOUNTER — Encounter: Payer: Self-pay | Admitting: Nurse Practitioner

## 2023-10-09 DIAGNOSIS — I429 Cardiomyopathy, unspecified: Secondary | ICD-10-CM

## 2023-10-09 DIAGNOSIS — R0600 Dyspnea, unspecified: Secondary | ICD-10-CM

## 2023-10-09 DIAGNOSIS — R0789 Other chest pain: Secondary | ICD-10-CM

## 2023-10-24 ENCOUNTER — Ambulatory Visit: Admission: RE | Admit: 2023-10-24 | Source: Ambulatory Visit

## 2023-10-25 ENCOUNTER — Ambulatory Visit: Admission: RE | Admit: 2023-10-25 | Source: Ambulatory Visit

## 2023-11-18 ENCOUNTER — Encounter: Payer: Self-pay | Admitting: Radiology

## 2023-11-18 ENCOUNTER — Other Ambulatory Visit: Payer: Self-pay | Admitting: Nurse Practitioner

## 2023-11-18 DIAGNOSIS — R0789 Other chest pain: Secondary | ICD-10-CM

## 2023-11-18 DIAGNOSIS — I429 Cardiomyopathy, unspecified: Secondary | ICD-10-CM

## 2023-11-18 DIAGNOSIS — R0600 Dyspnea, unspecified: Secondary | ICD-10-CM

## 2023-11-21 ENCOUNTER — Other Ambulatory Visit

## 2023-11-22 ENCOUNTER — Other Ambulatory Visit

## 2024-01-28 ENCOUNTER — Other Ambulatory Visit: Payer: Self-pay
